# Patient Record
Sex: Female | Born: 1990
Health system: Southern US, Community
[De-identification: ages and names within clinical notes are randomized; demographics above are authoritative.]

## PROBLEM LIST (undated history)

## (undated) ENCOUNTER — Inpatient Hospital Stay: Payer: Self-pay

## (undated) DIAGNOSIS — N200 Calculus of kidney: Secondary | ICD-10-CM

## (undated) DIAGNOSIS — F32A Depression, unspecified: Secondary | ICD-10-CM

## (undated) DIAGNOSIS — N3090 Cystitis, unspecified without hematuria: Secondary | ICD-10-CM

## (undated) DIAGNOSIS — G43009 Migraine without aura, not intractable, without status migrainosus: Secondary | ICD-10-CM

## (undated) DIAGNOSIS — F329 Major depressive disorder, single episode, unspecified: Secondary | ICD-10-CM

## (undated) DIAGNOSIS — F419 Anxiety disorder, unspecified: Secondary | ICD-10-CM

## (undated) DIAGNOSIS — M25569 Pain in unspecified knee: Secondary | ICD-10-CM

## (undated) HISTORY — DX: Migraine without aura, not intractable, without status migrainosus: G43.009

## (undated) HISTORY — DX: Depression, unspecified: F32.A

## (undated) HISTORY — DX: Anxiety disorder, unspecified: F41.9

## (undated) HISTORY — DX: Cystitis, unspecified without hematuria: N30.90

## (undated) HISTORY — DX: Calculus of kidney: N20.0

## (undated) HISTORY — DX: Major depressive disorder, single episode, unspecified: F32.9

## (undated) HISTORY — DX: Pain in unspecified knee: M25.569

---
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
## 2010-02-24 NOTE — Assessment & Plan Note (Signed)
Summary: 2:45  EAR/CLE   Vital Signs:  Patient profile:   20 year old female Weight:      115 pounds Temp:     98.1 degrees F oral Pulse rate:   72 / minute Pulse rhythm:   regular BP sitting:   108 / 68  (left arm) Cuff size:   regular  Vitals Entered By: Mervin Hack CMA Duncan Dull) (February 05, 2009 3:09 PM) CC: left ear pain   History of Present Illness: Having left ear pain started yesterday Especially bad if she touches it No discharge  No swimming  No cold symptoms no fever  tried tylenol and ibuprofen no help  Allergies: No Known Drug Allergies  Past History:  Past medical, surgical, family and social histories (including risk factors) reviewed for relevance to current acute and chronic problems.  Family History: Reviewed history from 09/20/2006 and no changes required. Dad with cardiomyopathy--diagnosed at age 84. Getting pacer in his 74's Carmelina Dane also with cardiomyopathy and died at 64  Social History: Reviewed history from 10/11/2007 and no changes required. 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   Head:  no sinus tenderness Ears:  marked tenderness to speculum but not with tragal movement on left moderate canal erythema with discharge TM normal  right ear normal Mouth:  no erythema and no exudates.     Impression & Recommendations:  Problem # 1:  OTITIS EXTERNA (ICD-380.10) Assessment New  no clear etiology but diagnosis is clear  will treat wtih analgesics and drops  Her updated medication list for this problem includes:    Cortisporin-tc 3.03-27-08-0.5 Mg/ml Susp (Neomycin-colist-hc-thonzonium) .Marland KitchenMarland KitchenMarland KitchenMarland Kitchen 4-5 drops in the left ear three times a day for 5 days  Complete Medication List: 1)  Multivitamins Tabs  (Multiple vitamin) .... Take 1 tablet by mouth once a day 2)  Cortisporin-tc 3.03-27-08-0.5 Mg/ml Susp (Neomycin-colist-hc-thonzonium) .... 4-5 drops in the left ear three times a day for 5 days  Patient Instructions: 1)  Try ibuprofen 400-600mg  three times a day for the pain 2)  Please schedule a follow-up appointment as needed .  Prescriptions: CORTISPORIN-TC 3.03-27-08-0.5 MG/ML SUSP (NEOMYCIN-COLIST-HC-THONZONIUM) 4-5 drops in the left ear three times a day for 5 days  #1 bottle x 1   Entered and Authorized by:   Cindee Salt MD   Signed by:   Cindee Salt MD on 02/05/2009   Method used:   Electronically to        [id number] (retail)       [address] [place], [place]  [address]       Ph: [phone] or [phone]       Fax: [phone]   RxID:   [id number]   Current Allergies (reviewed today): No known allergies

## 2010-07-27 ENCOUNTER — Ambulatory Visit (INDEPENDENT_AMBULATORY_CARE_PROVIDER_SITE_OTHER): Payer: BC Managed Care – PPO | Admitting: Internal Medicine

## 2010-07-27 ENCOUNTER — Encounter: Payer: Self-pay | Admitting: Internal Medicine

## 2010-07-27 VITALS — BP 110/60 | HR 88 | Temp 98.7°F | Ht 64.0 in | Wt 129.0 lb

## 2010-07-27 DIAGNOSIS — J039 Acute tonsillitis, unspecified: Secondary | ICD-10-CM

## 2010-07-27 MED ORDER — PENICILLIN V POTASSIUM 500 MG PO TABS: 1000.0000 mg | ORAL_TABLET | Freq: Two times a day (BID) | ORAL | Status: AC

## 2010-07-27 NOTE — Progress Notes (Signed)
  Subjective:    Patient ID: Stacey Curtis, female    DOB: 1990/01/28, 20 y.o.   MRN: [id number]  HPI Has had recurrent sore throats Tonsillitis and strep several times Treated at [facility] where she goes to school  Some issue with compliance per mom---patient states she has taken all the last time---about 6 weeks ago Pain started again 4-5 days ago Neck glands swelled No fever Pain is constant in throat--worse with drinking or eating Only a little cough in AM  Had consultation with ENT in [place] where dad lives Tonsillectomy recommended but she was too scared to proceed  No current outpatient prescriptions on file prior to visit.    No Known Allergies  No past medical history on file.  No past surgical history on file.  No family history on file.  History   Social History  . Marital Status: Single    Spouse Name: N/A    Number of Children: N/A  . Years of Education: N/A   Occupational History  . Not on file.   Social History Main Topics  . Smoking status: Never Smoker   . Smokeless tobacco: Never Used  . Alcohol Use: No  . Drug Use: No  . Sexually Active:    Other Topics Concern  . Not on file   Social History Narrative   Parents divorced---lives with Mom--currently in 30 th grade--9/09[place]2 sister and brother   Review of Systems Still able to eat and drink No urinary problems now No rash Gets "fever blisters inside mouth" also--not associated directly with the sore throat    Objective:   Physical Exam  Constitutional: She appears well-developed and well-nourished. No distress.  HENT:  Head: Normocephalic and atraumatic.  Right Ear: External ear normal.  Left Ear: External ear normal.       2+ tonsils with moderate inflammation but no sig exudates  Neck: Normal range of motion.       Fairly tender anterior cervical nodes bilaterally  Pulmonary/Chest: Effort normal and breath sounds normal. No respiratory  distress. She has no wheezes. She has no rales.  Lymphadenopathy:    She has cervical adenopathy.  Skin: Skin is warm. No rash noted.          Assessment & Plan:

## 2010-07-27 NOTE — Assessment & Plan Note (Signed)
Has had recurrent strep Will treat with PCN again Discussed the tonsillectomy--she and mom will consider again

## 2010-08-26 ENCOUNTER — Ambulatory Visit: Payer: Self-pay | Admitting: Otolaryngology

## 2012-09-05 ENCOUNTER — Ambulatory Visit (INDEPENDENT_AMBULATORY_CARE_PROVIDER_SITE_OTHER): Payer: BC Managed Care – PPO | Admitting: Internal Medicine

## 2012-09-05 ENCOUNTER — Encounter: Payer: Self-pay | Admitting: Internal Medicine

## 2012-09-05 VITALS — BP 100/60 | HR 77 | Wt 135.0 lb

## 2012-09-05 DIAGNOSIS — R41840 Attention and concentration deficit: Secondary | ICD-10-CM

## 2012-09-05 DIAGNOSIS — R51 Headache: Secondary | ICD-10-CM

## 2012-09-05 DIAGNOSIS — R519 Headache, unspecified: Secondary | ICD-10-CM | POA: Insufficient documentation

## 2012-09-05 LAB — HEPATIC FUNCTION PANEL
AST: 17 U/L (ref 0–37)
Albumin: 4.6 g/dL (ref 3.5–5.2)
Total Protein: 7.6 g/dL (ref 6.0–8.3)

## 2012-09-05 LAB — TSH: TSH: 3 u[IU]/mL (ref 0.35–5.50)

## 2012-09-05 LAB — CBC WITH DIFFERENTIAL/PLATELET
Basophils Absolute: 0 10*3/uL (ref 0.0–0.1)
Eosinophils Relative: 3.1 % (ref 0.0–5.0)
HCT: 40.4 % (ref 36.0–46.0)
Lymphs Abs: 1.9 10*3/uL (ref 0.7–4.0)
MCV: 87.6 fl (ref 78.0–100.0)
Monocytes Absolute: 0.7 10*3/uL (ref 0.1–1.0)
Neutro Abs: 3.8 10*3/uL (ref 1.4–7.7)
Platelets: 245 10*3/uL (ref 150.0–400.0)
RDW: 13.2 % (ref 11.5–14.6)

## 2012-09-05 LAB — BASIC METABOLIC PANEL
BUN: 11 mg/dL (ref 6–23)
Chloride: 105 mEq/L (ref 96–112)
Glucose, Bld: 97 mg/dL (ref 70–99)
Potassium: 4 mEq/L (ref 3.5–5.1)

## 2012-09-05 LAB — T4, FREE: Free T4: 0.84 ng/dL (ref 0.60–1.60)

## 2012-09-05 MED ORDER — CITALOPRAM HYDROBROMIDE 20 MG PO TABS: 10.0000 mg | ORAL_TABLET | Freq: Every day | ORAL | Status: DC

## 2012-09-05 MED ORDER — SUMATRIPTAN SUCCINATE 100 MG PO TABS: 50.0000 mg | ORAL_TABLET | Freq: Every day | ORAL | Status: DC | PRN

## 2012-09-05 NOTE — Assessment & Plan Note (Signed)
Features of migraines which she had years ago Will give imitrex for prn No worrisome neuro findings

## 2012-09-05 NOTE — Assessment & Plan Note (Addendum)
This is her most concerning symptom No obvious physical problems but will check labs No neuro findings of concern Explained that this can't be ADHD without prior problems   Could be from the anxiety---seems to be daily Not depressed May want to consider SSRI--discussed more. She would like to try Rx
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
## 2012-09-06 ENCOUNTER — Encounter: Payer: Self-pay | Admitting: *Deleted

## 2012-09-06 ENCOUNTER — Telehealth: Payer: Self-pay | Admitting: *Deleted

## 2012-09-06 NOTE — Telephone Encounter (Signed)
Pt was in recently for OV and forgot to ask about birth control, per pt she was on the lowest dose of estrogen birth control pill, pt would like that again. Please advise

## 2012-09-07 NOTE — Telephone Encounter (Signed)
Please find out exactly what she was taking (from pharmacist if necessary)  Okay to refill x 1 year

## 2012-09-11 MED ORDER — NORETHIN ACE-ETH ESTRAD-FE 1-20 MG-MCG PO TABS: 1.0000 | ORAL_TABLET | Freq: Every day | ORAL | Status: DC

## 2012-09-11 NOTE — Telephone Encounter (Signed)
Okay to send Rx for the generic of lo-estrin 1/20 1 year Rx Have her call if breakthrough bleeding or other problems

## 2012-09-11 NOTE — Telephone Encounter (Signed)
rx sent to pharmacy by e-script Spoke with patient and advised results   

## 2012-09-11 NOTE — Telephone Encounter (Signed)
Spoke with patient and she doesn't remember what she was taking and she can't remember which pharmacy back home she was using because it's been so long ago, she would like for you just to prescribe a low dose estrogen pill

## 2012-09-18 ENCOUNTER — Ambulatory Visit: Payer: BC Managed Care – PPO | Admitting: Internal Medicine

## 2012-09-18 DIAGNOSIS — Z0289 Encounter for other administrative examinations: Secondary | ICD-10-CM

## 2012-10-18 ENCOUNTER — Telehealth: Payer: Self-pay | Admitting: Internal Medicine

## 2012-10-18 NOTE — Telephone Encounter (Signed)
You can skip the sugar pills that are in the pack for the last week and restart a new pack if you want to avoid getting a period. I am not sure why she hasn't had a withdrawal bleed (period) since she hasn't taken the pill in the last week.  I would recommend she take the rest of the pack normally and take the non medicine pills for the 4th week. If she doesn't have a period then, we may need to increase the estrogen part of her pills. She should be sure to use condoms (always---but especially in the next month since she has had a gap with the meds) She should do a pregnancy test to be sure she isn't pregnant if she has had sex in the past couple of weeks  Let me know if you think I need to talk to her

## 2012-10-18 NOTE — Telephone Encounter (Signed)
Patient Information:  Caller Name: Latesha  Phone: [phone]  Patient: Stacey Curtis  Gender: Female  DOB: 1991/01/25  Age: 22 Years  PCP: Tillman Abide [facility])  Pregnant: No  Office Follow Up:  Does the office need to follow up with this patient?: Yes  Instructions For The Office: Patient has questions regardin birth control.  RN Note:  Used Hormonal Contraceptive guideline in [facility], disposition: see provider within 2 weeks for "Requesting information about birth control." Advised patient to take a pregnancy test at this time to ensure no pregnancy. Please contact patient with further instructions regarding what she needs to do about her oral contraception.  Symptoms  Reason For Call & Symptoms: Has been taking Microgestin for birth control. She skipped the last week of pills in her pack and instead took 2 pills of the newest pack. She has since not taken any pills at all for 7 days. Has not had a period. Reports some mild spotting one day, but nothing else. Has not take a pregnancy test. Reports increased fatigue, but is able to perform daily activities.  Reviewed Health History In EMR: Yes  Reviewed Medications In EMR: Yes  Reviewed Allergies In EMR: Yes  Reviewed Surgeries / Procedures: Yes  Date of Onset of Symptoms: 10/11/2012 OB / GYN:  LMP: 08/31/2012  Guideline(s) Used:  No Protocol Available - Information Only  Disposition Per Guideline:   Discuss with PCP and Callback by Nurse Today  Reason For Disposition Reached:   Nursing judgment  Advice Given:  N/A  Patient Will Follow Care Advice:  YES

## 2012-10-19 NOTE — Telephone Encounter (Signed)
Spoke with patient and advised results, she understood

## 2012-11-01 ENCOUNTER — Encounter: Payer: Self-pay | Admitting: *Deleted

## 2012-11-01 ENCOUNTER — Encounter: Payer: Self-pay | Admitting: Internal Medicine

## 2012-11-01 ENCOUNTER — Ambulatory Visit (INDEPENDENT_AMBULATORY_CARE_PROVIDER_SITE_OTHER): Payer: BC Managed Care – PPO | Admitting: Internal Medicine

## 2012-11-01 VITALS — BP 110/70 | HR 87 | Temp 98.0°F | Wt 136.0 lb

## 2012-11-01 DIAGNOSIS — R41840 Attention and concentration deficit: Secondary | ICD-10-CM

## 2012-11-01 DIAGNOSIS — J029 Acute pharyngitis, unspecified: Secondary | ICD-10-CM

## 2012-11-01 NOTE — Assessment & Plan Note (Signed)
Only took the SSRI for 2 weeks and stopped it Convinced she doesn't have anxiety (and this may be correct) Discussed that she doesn't fit criteria for ADHD and I am not comfortable with adderall Rx Offered psychologist to do testing -she didn't want this

## 2012-11-01 NOTE — Assessment & Plan Note (Signed)
Seems like classic viral infection Discussed supportive care

## 2012-11-01 NOTE — Progress Notes (Signed)
  Subjective:    Patient ID: Stacey Curtis, female    DOB: Sep 15, 1990, 22 y.o.   MRN: [id number]  HPI Having bad sore throat for 3 days Coughing fits-- got some blood (no mucus--seems to be from back of her throat) Not really congested Fever for the first day No SOB No ear pain  Only tried robitussin for cough Allergy med no help  Stopped the nerve med--it didn't work Wound up dropping out of [employer] Now working full time for [employer]  Current Outpatient Prescriptions on File Prior to Visit  Medication Sig Dispense Refill  . norethindrone-ethinyl estradiol (JUNEL FE,GILDESS FE,LOESTRIN FE) 1-20 MG-MCG tablet Take 1 tablet by mouth daily.  1 Package  11  . SUMAtriptan (IMITREX) 100 MG tablet Take 0.5-1 tablets (50-100 mg total) by mouth daily as needed for migraine.  10 tablet  1   No current facility-administered medications on file prior to visit.    No Known Allergies  No past medical history on file.  No past surgical history on file.  No family history on file.  History   Social History  . Marital Status: Single    Spouse Name: N/A    Number of Children: N/A  . Years of Education: N/A   Occupational History  . 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 Smoking status: Never Smoker   . Smokeless tobacco: Never Used  . Alcohol Use: No  . Drug Use: No  . Sexual Activity:    Other Topics Concern  . Not on file   Social History Narrative                      Review of Systems Period did stop---had spotting for 2 days Okay with this now     Objective:   Physical Exam  Constitutional: She appears well-developed and well-nourished. No distress.  HENT:  Head: Normocephalic and atraumatic.  Mild pharyngeal injection without exudates Mild nasal congestion TMs normal  Neck: Normal range of motion. Neck supple. No thyromegaly present.  Pulmonary/Chest: Effort normal and breath sounds normal. No respiratory distress. She  has no wheezes. She has no rales.  Lymphadenopathy:    She has no cervical adenopathy.  Skin: No rash noted.  Psychiatric:  Tearful when I was not excited about prescribing adderall for her          Assessment & Plan:

## 2013-01-10 ENCOUNTER — Ambulatory Visit (INDEPENDENT_AMBULATORY_CARE_PROVIDER_SITE_OTHER): Payer: BC Managed Care – PPO | Admitting: Family Medicine

## 2013-01-10 VITALS — BP 98/68 | HR 77 | Temp 98.3°F | Resp 18 | Ht 65.0 in | Wt 134.0 lb

## 2013-01-10 DIAGNOSIS — R319 Hematuria, unspecified: Secondary | ICD-10-CM

## 2013-01-10 DIAGNOSIS — N390 Urinary tract infection, site not specified: Secondary | ICD-10-CM

## 2013-01-10 DIAGNOSIS — R309 Painful micturition, unspecified: Secondary | ICD-10-CM

## 2013-01-10 LAB — POCT UA - MICROSCOPIC ONLY
Bacteria, U Microscopic: NEGATIVE
Casts, Ur, LPF, POC: NEGATIVE
Crystals, Ur, HPF, POC: NEGATIVE
Mucus, UA: NEGATIVE
RBC, urine, microscopic: NEGATIVE
Yeast, UA: NEGATIVE

## 2013-01-10 LAB — POCT URINALYSIS DIPSTICK
Blood, UA: NEGATIVE
Glucose, UA: 250
Ketones, UA: 15
Nitrite, UA: POSITIVE
Protein, UA: 300
Spec Grav, UA: 1.015
Urobilinogen, UA: >=8
pH, UA: 5

## 2013-01-10 MED ORDER — CEPHALEXIN 500 MG PO CAPS: 500.0000 mg | ORAL_CAPSULE | Freq: Four times a day (QID) | ORAL | Status: DC

## 2013-01-10 MED ORDER — TRAMADOL HCL 50 MG PO TABS: 50.0000 mg | ORAL_TABLET | Freq: Three times a day (TID) | ORAL | Status: DC | PRN

## 2013-01-10 NOTE — Progress Notes (Signed)
Subjective:    Patient ID: Stacey Curtis, female    DOB: 06-24-1990, 22 y.o.   MRN: [id number] Chief Complaint  Patient presents with  . Hematuria    x2 days   . Urinary Retention    HPI  Sees urology Dr. Achilles Dunk in [place] for the past several yrs due to h/o recurrent cystitis and pyelonephritis of unknown etiology.  Was started on prophylactic macrobid qd but was able to wean off and did not have any sxs in over a yr.  Last night sxs returned and she saw drops of blood in the toilet which she thinks it was from her bladder. She has a h/o resistance to Bactrim but had a rx for it and took a dose last night and again this morning but doesn't seem to be helping.  Also started on Azo last night. No f/c, no vag d/c, was nauseated yesterday but resolved now, no vomiting. No diarrhea or constipation, no back/flank/abd/pelvic pain.  History reviewed. No pertinent past medical history. Current Outpatient Prescriptions on File Prior to Visit  Medication Sig Dispense Refill  . norethindrone-ethinyl estradiol (JUNEL FE,GILDESS FE,LOESTRIN FE) 1-20 MG-MCG tablet Take 1 tablet by mouth daily.  1 Package  11  . SUMAtriptan (IMITREX) 100 MG tablet Take 0.5-1 tablets (50-100 mg total) by mouth daily as needed for migraine.  10 tablet  1   No current facility-administered medications on file prior to visit.   No Known Allergies   Review of Systems  Constitutional: Negative for fever, chills, diaphoresis, activity change, appetite change, fatigue and unexpected weight change.  Gastrointestinal: Positive for nausea. Negative for vomiting, abdominal pain, diarrhea, constipation, blood in stool, anal bleeding and rectal pain.  Genitourinary: Positive for dysuria, frequency, hematuria and difficulty urinating. Negative for urgency, decreased urine volume, vaginal bleeding, vaginal discharge, genital sores, vaginal pain, menstrual problem, pelvic pain and dyspareunia.  Musculoskeletal: Negative for gait  problem.  Skin: Negative for rash.  Hematological: Negative for adenopathy.  Psychiatric/Behavioral: The patient is not nervous/anxious.       BP 98/68  Pulse 77  Temp(Src) 98.3 F (36.8 C) (Oral)  Resp 18  Ht 5\' 5"  (1.651 m)  Wt 134 lb (60.782 kg)  BMI 22.30 kg/m2  SpO2 98%  LMP 12/12/2012 Objective:   Physical Exam  Constitutional: She is oriented to person, place, and time. She appears well-developed and well-nourished. No distress.  HENT:  Head: Normocephalic and atraumatic.  Cardiovascular: Normal rate, regular rhythm, normal heart sounds and intact distal pulses.   Pulmonary/Chest: Effort normal and breath sounds normal.  Abdominal: Soft. Bowel sounds are normal. She exhibits no distension. There is no hepatosplenomegaly. There is no tenderness. There is no rebound, no guarding and no CVA tenderness.  Neurological: She is alert and oriented to person, place, and time.  Skin: Skin is warm and dry. She is not diaphoretic.  Psychiatric: She has a normal mood and affect. Her behavior is normal.          Results for orders placed in visit on 01/10/13  POCT UA - MICROSCOPIC ONLY      Result Value Range   WBC, Ur, HPF, POC TNTC     RBC, urine, microscopic neg     Bacteria, U Microscopic neg     Mucus, UA neg     Epithelial cells, urine per micros TNTC     Crystals, Ur, HPF, POC neg     Casts, Ur, LPF, POC neg     Yeast, UA  neg    POCT URINALYSIS DIPSTICK      Result Value Range   Color, UA red     Clarity, UA clear     Glucose, UA 250     Bilirubin, UA lg     Ketones, UA 15     Spec Grav, UA 1.015     Blood, UA neg     pH, UA 5.0     Protein, UA 300     Urobilinogen, UA >=8.0     Nitrite, UA positive     Leukocytes, UA large (3+)      Assessment & Plan:   Blood in urine - Plan: POCT UA - Microscopic Only, POCT urinalysis dipstick  Pain with urination - Plan: POCT UA - Microscopic Only, POCT urinalysis dipstick  UTI - Plan: Urine culture - prev on  prophylactic macrobid and h/o resistance to bactrim - UA looks very much like UTI and pt has already started partial trx w/ bactrim so will cover w/ full dose of kelfex while UClx is P - f/u w/ urologist for further managmement due to extensive hx.  Meds ordered this encounter  Medications  . Phenazopyridine HCl (AZO TABS PO)    Sig: Take by mouth.  . DISCONTD: nitrofurantoin, macrocrystal-monohydrate, (MACROBID) 100 MG capsule    Sig: Take 100 mg by mouth 2 (two) times daily.  Marland Kitchen DISCONTD: sulfamethoxazole-trimethoprim (BACTRIM DS) 800-160 MG per tablet    Sig: Take 1 tablet by mouth 2 (two) times daily.  . cephALEXin (KEFLEX) 500 MG capsule    Sig: Take 1 capsule (500 mg total) by mouth 4 (four) times daily.    Dispense:  28 capsule    Refill:  0  . traMADol (ULTRAM) 50 MG tablet    Sig: Take 1 tablet (50 mg total) by mouth every 8 (eight) hours as needed for moderate pain.    Dispense:  16 tablet    Refill:  0   Norberto Sorenson, MD MPH

## 2013-01-10 NOTE — Patient Instructions (Signed)
Urinary Tract Infection  Urinary tract infections (UTIs) can develop anywhere along your urinary tract. Your urinary tract is your body's drainage system for removing wastes and extra water. Your urinary tract includes two kidneys, two ureters, a bladder, and a urethra. Your kidneys are a pair of bean-shaped organs. Each kidney is about the size of your fist. They are located below your ribs, one on each side of your spine.  CAUSES  Infections are caused by microbes, which are microscopic organisms, including fungi, viruses, and bacteria. These organisms are so small that they can only be seen through a microscope. Bacteria are the microbes that most commonly cause UTIs.  SYMPTOMS   Symptoms of UTIs may vary by age and gender of the patient and by the location of the infection. Symptoms in young women typically include a frequent and intense urge to urinate and a painful, burning feeling in the bladder or urethra during urination. Older women and men are more likely to be tired, shaky, and weak and have muscle aches and abdominal pain. A fever may mean the infection is in your kidneys. Other symptoms of a kidney infection include pain in your back or sides below the ribs, nausea, and vomiting.  DIAGNOSIS  To diagnose a UTI, your caregiver will ask you about your symptoms. Your caregiver also will ask to provide a urine sample. The urine sample will be tested for bacteria and white blood cells. White blood cells are made by your body to help fight infection.  TREATMENT   Typically, UTIs can be treated with medication. Because most UTIs are caused by a bacterial infection, they usually can be treated with the use of antibiotics. The choice of antibiotic and length of treatment depend on your symptoms and the type of bacteria causing your infection.  HOME CARE INSTRUCTIONS   If you were prescribed antibiotics, take them exactly as your caregiver instructs you. Finish the medication even if you feel better after you  have only taken some of the medication.   Drink enough water and fluids to keep your urine clear or pale yellow.   Avoid caffeine, tea, and carbonated beverages. They tend to irritate your bladder.   Empty your bladder often. Avoid holding urine for long periods of time.   Empty your bladder before and after sexual intercourse.   After a bowel movement, women should cleanse from front to back. Use each tissue only once.  SEEK MEDICAL CARE IF:    You have back pain.   You develop a fever.   Your symptoms do not begin to resolve within 3 days.  SEEK IMMEDIATE MEDICAL CARE IF:    You have severe back pain or lower abdominal pain.   You develop chills.   You have nausea or vomiting.   You have continued burning or discomfort with urination.  MAKE SURE YOU:    Understand these instructions.   Will watch your condition.   Will get help right away if you are not doing well or get worse.  Document Released: 10/21/2004 Document Revised: 07/13/2011 Document Reviewed: 02/19/2011  Exit[employer] Patient Information 2014 [employer].

## 2013-01-11 ENCOUNTER — Telehealth: Payer: Self-pay

## 2013-01-11 LAB — URINE CULTURE

## 2013-01-11 MED ORDER — ONDANSETRON 8 MG PO TBDP: 8.0000 mg | ORAL_TABLET | Freq: Three times a day (TID) | ORAL | Status: DC | PRN

## 2013-01-11 NOTE — Telephone Encounter (Signed)
Patient is calling to ask a nurse or doctor about some of the symptoms she is having after her visit yesterday  [phone]

## 2013-01-11 NOTE — Telephone Encounter (Signed)
She has vomited after taking the Tramadol. Advised her to discontinue this, get lots of fluids, and take the antibiotics. She should really push fluids and come back in if she is worsening. She states she will increase her fluid intake and come in if not improving tomorrow advised her to come in sooner if worse, or to ER.

## 2013-01-11 NOTE — Telephone Encounter (Signed)
Spoke with  Pt, she is very nauseous and would like a nausea med sent in. Please advise

## 2013-01-11 NOTE — Telephone Encounter (Signed)
Zofran sent. 

## 2013-01-19 ENCOUNTER — Encounter: Payer: Self-pay | Admitting: Family Medicine

## 2013-01-19 ENCOUNTER — Ambulatory Visit (INDEPENDENT_AMBULATORY_CARE_PROVIDER_SITE_OTHER): Payer: BC Managed Care – PPO | Admitting: Family Medicine

## 2013-01-19 ENCOUNTER — Telehealth: Payer: Self-pay | Admitting: Family Medicine

## 2013-01-19 VITALS — BP 116/62 | HR 79 | Temp 97.9°F | Ht 64.0 in | Wt 135.8 lb

## 2013-01-19 DIAGNOSIS — T753XXA Motion sickness, initial encounter: Secondary | ICD-10-CM

## 2013-01-19 DIAGNOSIS — N390 Urinary tract infection, site not specified: Secondary | ICD-10-CM

## 2013-01-19 MED ORDER — NITROFURANTOIN MONOHYD MACRO 100 MG PO CAPS: 100.0000 mg | ORAL_CAPSULE | Freq: Two times a day (BID) | ORAL | Status: DC

## 2013-01-19 MED ORDER — SCOPOLAMINE 1 MG/3DAYS TD PT72: 1.0000 | MEDICATED_PATCH | TRANSDERMAL | Status: DC

## 2013-01-19 MED ORDER — SULFAMETHOXAZOLE-TMP DS 800-160 MG PO TABS: 1.0000 | ORAL_TABLET | Freq: Two times a day (BID) | ORAL | Status: DC

## 2013-01-19 NOTE — Progress Notes (Signed)
Date:  01/19/2013   Name:  Stacey Curtis   DOB:  [birth year]   MRN:  [id number] Gender: female Age: 22 y.o.  PCP:  Tillman Abide, MD   History of Present Illness:  Patient presents with burning, urgency. No vaginal discharge or external irritation.  No STD exposure. No abd pain, no flank pain.  History is reviewed in prior office visit where she went [facility], and Dr. Clelia Croft gave her a course of Keflex. The culture ended up having multiple colonies, so the patient is still quite symptomatic. She did have some Septra at home, which she took and felt better while she was taking it, but now she is still has had a return of symptoms.  She also needs to have some scopolamine patches for a upcoming trip.  Patient Active Problem List   Diagnosis Date Noted  . Worsening headaches 09/05/2012  . Disturbed concentration 09/05/2012  . PATELLO-FEMORAL SYNDROME 08/16/2008  . COMMON MIGRAINE 05/24/2008  . FIBROCYSTIC BREAST DISEASE, MINIMAL 05/01/2008    No past medical history on file.  Past Surgical History  Procedure Laterality Date  . Tonsillectomy      History   Social History  . Marital Status: Single    Spouse Name: N/A    Number of Children: N/A  . Years of Education: N/A   Occupational History  . 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 Smoking status: Never Smoker   . Smokeless tobacco: Never Used  . Alcohol Use: No  . Drug Use: No  . Sexual Activity: Not on file   Other Topics Concern  . Not on file   Social History Narrative                       No family history on file.  No Known Allergies  Medication list reviewed and updated in full in [facility].  ROS: GEN:  no fevers, chills. GI: No n/v/d, eating normally Otherwise, ROS is as per the HPI.  PHYSICAL EXAM  Filed Vitals:   01/19/13 1023  BP: 116/62  Pulse: 79  Temp: 97.9 F (36.6 C)  TempSrc: Oral  Height: 5\' 4"  (1.626 m)    Weight: 135 lb 12 oz (61.576 kg)  SpO2: 97%    GEN: WDWN, A&Ox4,NAD. Non-toxic HEENT: Atraumatc, normocephalic. CV: RRR, No M/G/R PULM: CTA B, No wheezes, crackles, or rhonchi ABD: S, NT, ND, +BS, no rebound. No CVAT. + suprapubic tenderness. EXT: No c/c/e  Objective Data: Results for orders placed in visit on 01/10/13  URINE CULTURE      Result Value Range   Colony Count 40,000 COLONIES/ML     Organism ID, Bacteria Multiple bacterial morphotypes present, none     Organism ID, Bacteria predominant. Suggest appropriate recollection if      Organism ID, Bacteria clinically indicated.    POCT UA - MICROSCOPIC ONLY      Result Value Range   WBC, Ur, HPF, POC TNTC     RBC, urine, microscopic neg     Bacteria, U Microscopic neg     Mucus, UA neg     Epithelial cells, urine per micros TNTC     Crystals, Ur, HPF, POC neg     Casts, Ur, LPF, POC neg     Yeast, UA neg    POCT URINALYSIS DIPSTICK      Result Value Range   Color,  UA red     Clarity, UA clear     Glucose, UA 250     Bilirubin, UA lg     Ketones, UA 15     Spec Grav, UA 1.015     Blood, UA neg     pH, UA 5.0     Protein, UA 300     Urobilinogen, UA >=8.0     Nitrite, UA positive     Leukocytes, UA large (3+)      UTI (urinary tract infection)  Sea sickness, initial encounter   UTI, unclear organism causing this. U. Cx. meds for all  Refill her macrobid until sees Dr. Achilles Dunk   Meds ordered this encounter  Medications  . sulfamethoxazole-trimethoprim (BACTRIM DS) 800-160 MG per tablet    Sig: Take 1 tablet by mouth 2 (two) times daily.    Dispense:  28 tablet    Refill:  0  . nitrofurantoin, macrocrystal-monohydrate, (MACROBID) 100 MG capsule    Sig: Take 1 capsule (100 mg total) by mouth 2 (two) times daily.    Dispense:  60 capsule    Refill:  2  . scopolamine (TRANSDERM-SCOP) 1.5 MG    Sig: Place 1 patch (1.5 mg total) onto the skin every 3 (three) days.    Dispense:  10 patch    Refill:  0      Signed,  Lula Kolton T. Hershel Corkery, MD, CAQ Sports Medicine  [facility] [address] [place] [address] Phone: [phone] Fax: [phone]

## 2013-01-19 NOTE — Telephone Encounter (Signed)
Called pt and notified her that her rx has been sent to the pharmacy for the motion sickness patches

## 2013-01-19 NOTE — Telephone Encounter (Signed)
Pt needs a motion sickness patch for behind her ear for her cruise. She will be gone for 10 days so she is not sure how many patches that would be but she needs enough for 10 days.  She is leaving at 12pm.  She said she just forgot to ask for this.  [facility] [address]

## 2013-01-19 NOTE — Progress Notes (Signed)
Pre-visit discussion using our clinic review tool. No additional management support is needed unless otherwise documented below in the visit note.  

## 2013-01-19 NOTE — Telephone Encounter (Signed)
done

## 2013-01-21 LAB — URINE CULTURE: Colony Count: 40000

## 2013-01-22 ENCOUNTER — Encounter: Payer: Self-pay | Admitting: *Deleted

## 2013-01-31 ENCOUNTER — Telehealth: Payer: Self-pay

## 2013-01-31 MED ORDER — LEVOFLOXACIN 500 MG PO TABS: 500.0000 mg | ORAL_TABLET | Freq: Every day | ORAL | Status: DC

## 2013-01-31 NOTE — Telephone Encounter (Signed)
Patient notified as instructed by telephone.  Levaquin prescription sent to [facility] [place].

## 2013-01-31 NOTE — Telephone Encounter (Signed)
Pt called Dr Cope's office and computers are down this morning and advised pt to cb to Dr KB Home	[place] 02/01[place]13; pt said will take several days to get appt with Dr Achilles Dunkope and pt is on day 13 of antibiotic and symptoms have not really improved. Pt continues with urgency and voiding small amounts frequently and pain when urinates. No fever. Pt wants to know if Dr Patsy Lageropland can suggest what to do until can see Dr Achilles Dunkope? [facility] [place]. Pt request cb.

## 2013-01-31 NOTE — Telephone Encounter (Signed)
Call  This is very challenging since 2 urine cultures did not grow out a predominant bacteria, so we are going to have to change to a different class of abx and hope for the best.  Levaquin 500 mg, 1 po daily, #10, 0 ref

## 2013-04-12 ENCOUNTER — Telehealth: Payer: Self-pay

## 2013-04-12 MED ORDER — SULFAMETHOXAZOLE-TMP DS 800-160 MG PO TABS: 1.0000 | ORAL_TABLET | Freq: Two times a day (BID) | ORAL | Status: DC

## 2013-04-12 NOTE — Telephone Encounter (Signed)
rx sent to pharmacy by e-script Spoke with patient and advised results   

## 2013-04-12 NOTE — Telephone Encounter (Signed)
I would like to defer to Dr. Alphonsus SiasLetvak who is her regular MD. We all have different practice patterns with how we handle this.   I would probably be ok with calling abx in this case, but he may not be.

## 2013-04-12 NOTE — Telephone Encounter (Signed)
Okay to send Rx for generic bactrim DS 1 tab bid #6 x 0 If symptoms persist, will need appt

## 2013-04-12 NOTE — Telephone Encounter (Signed)
Pt cannot come in for appt until next week with urologist and pt request [facility];  04/11/13 started burning and pain upon urination, frequency but voiding very small amounts. No fever. Pt taking AZO but pt said last time she had UTI pt was told by doctor if cannot come in and has symptoms to call and antibiotics will be called in. Pt request note to go to Dr Patsy Lageropland. Pt request cb.

## 2013-07-03 ENCOUNTER — Telehealth: Payer: Self-pay | Admitting: Internal Medicine

## 2013-07-03 NOTE — Telephone Encounter (Signed)
VM not set up yet, will try again later

## 2013-07-03 NOTE — Telephone Encounter (Signed)
Pt returned your call and wants you to call her back at your earliest convenience. [phone] Thank you.

## 2013-07-03 NOTE — Telephone Encounter (Signed)
Spoke with patient and advised her that she contact her high school for her vaccine record, per pt she will

## 2013-07-03 NOTE — Telephone Encounter (Signed)
Pt faxed over forms from [employer] Put on dee's desk

## 2013-07-25 ENCOUNTER — Ambulatory Visit: Payer: BC Managed Care – PPO | Admitting: Internal Medicine

## 2013-08-03 ENCOUNTER — Telehealth: Payer: Self-pay | Admitting: Internal Medicine

## 2013-08-03 ENCOUNTER — Ambulatory Visit (INDEPENDENT_AMBULATORY_CARE_PROVIDER_SITE_OTHER): Payer: BC Managed Care – PPO | Admitting: Internal Medicine

## 2013-08-03 ENCOUNTER — Ambulatory Visit: Payer: BC Managed Care – PPO | Admitting: Internal Medicine

## 2013-08-03 ENCOUNTER — Encounter: Payer: Self-pay | Admitting: Internal Medicine

## 2013-08-03 VITALS — BP 100/60 | HR 76 | Temp 98.3°F | Ht 65.0 in | Wt 130.0 lb

## 2013-08-03 DIAGNOSIS — H01003 Unspecified blepharitis right eye, unspecified eyelid: Secondary | ICD-10-CM

## 2013-08-03 DIAGNOSIS — Z23 Encounter for immunization: Secondary | ICD-10-CM

## 2013-08-03 DIAGNOSIS — H01009 Unspecified blepharitis unspecified eye, unspecified eyelid: Secondary | ICD-10-CM

## 2013-08-03 MED ORDER — SULFACETAMIDE SODIUM 10 % OP SOLN: 2.0000 [drp] | OPHTHALMIC | Status: DC

## 2013-08-03 MED ORDER — SULFACETAMIDE SODIUM 10 % OP OINT: TOPICAL_OINTMENT | Freq: Four times a day (QID) | OPHTHALMIC | Status: DC

## 2013-08-03 NOTE — Progress Notes (Signed)
Pre visit review using our clinic review tool, if applicable. No additional management support is needed unless otherwise documented below in the visit note. 

## 2013-08-03 NOTE — Progress Notes (Signed)
   Subjective:    Patient ID: Stacey Curtis, female    DOB: 1990/05/05, 23 y.o.   MRN: [id number]  HPI Here for immunization review and certification Needs menactra Doesn't want 3rd guardasil  Noticed stye 8 days ago Soreness noted Tried compresses and it drained  Seemed better Noticed swelling in upper lid now (the stye was in lower lid)  No current outpatient prescriptions on file prior to visit.   No current facility-administered medications on file prior to visit.    No Known Allergies  No past medical history on file.  Past Surgical History  Procedure Laterality Date  . Tonsillectomy      No family history on file.  History   Social History  . Marital Status: Single    Spouse Name: N/A    Number of Children: N/A  . Years of Education: N/A   Occupational History  . 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 Smoking status: Never Smoker   . Smokeless tobacco: Never Used  . Alcohol Use: No  . Drug Use: No  . Sexual Activity: Not on file   Other Topics Concern  . Not on file   Social History Narrative                      Review of Systems No contacts No fever Mild URI symptoms in past few days ?vision slightly off in that eye    Objective:   Physical Exam  Eyes:  Mild inflammation and puffiness around puncta of right upper lid No stye now Conjunctiva quiet          Assessment & Plan:

## 2013-08-03 NOTE — Addendum Note (Signed)
Addended by: Tillman AbideLETVAK, RICHARD I on: 08/03/2013 01:40 PM   Modules accepted: Orders

## 2013-08-03 NOTE — Telephone Encounter (Signed)
Patient was seen earlier today for her eye.  Patient went to pick up her prescription for her eye at [facility] and they told her it will take 3 days for them to get the prescription in.  She called other pharmacies and they didn't have the prescription either.  Patient said her eye is killing her and she wants to know if anything else can be called in to the pharmacy.  Please call patient when prescription is called in to the pharmacy.

## 2013-08-03 NOTE — Telephone Encounter (Signed)
Spoke with patient and advised results Spoke with the pharmacy and they do have in stock

## 2013-08-03 NOTE — Telephone Encounter (Signed)
Please call the pharmacy and confirm they have the solution---then let her know the prescription was changed to drops instead of ointment

## 2013-08-03 NOTE — Assessment & Plan Note (Signed)
Discussed compresses still Add sulfacetamide ointment

## 2013-08-03 NOTE — Addendum Note (Signed)
Addended by: Sueanne MargaritaSMITH, DESHANNON L on: 08/03/2013 08:05 AM   Modules accepted: Orders

## 2013-10-15 ENCOUNTER — Telehealth: Payer: Self-pay | Admitting: *Deleted

## 2013-10-15 NOTE — Telephone Encounter (Signed)
Pt called and left message stating that she has a uti, and requested an abx be called in. I returned call to pt, and told her she would need to be seen. I offered her an appointment, but she refused and said she would call her urologist.

## 2013-10-15 NOTE — Telephone Encounter (Signed)
Will do!

## 2013-10-15 NOTE — Telephone Encounter (Signed)
Agreed We can't just treat over the phone  Check on her tomorrow please

## 2013-11-08 ENCOUNTER — Ambulatory Visit (INDEPENDENT_AMBULATORY_CARE_PROVIDER_SITE_OTHER): Payer: 59 | Admitting: Family Medicine

## 2013-11-08 ENCOUNTER — Encounter: Payer: Self-pay | Admitting: Family Medicine

## 2013-11-08 VITALS — BP 90/60 | HR 85 | Temp 98.7°F | Ht 65.0 in | Wt 136.8 lb

## 2013-11-08 DIAGNOSIS — J029 Acute pharyngitis, unspecified: Secondary | ICD-10-CM

## 2013-11-08 LAB — POCT RAPID STREP A (OFFICE): RAPID STREP A SCREEN: NEGATIVE

## 2013-11-08 MED ORDER — AMOXICILLIN 500 MG PO CAPS: 1000.0000 mg | ORAL_CAPSULE | Freq: Two times a day (BID) | ORAL | Status: DC

## 2013-11-08 NOTE — Progress Notes (Signed)
   Dr. Karleen HampshireSpencer T. Ceniyah Thorp, MD, CAQ Sports Medicine Primary [facility] and Sports [facility] [address] [place], [id number] Phone: [phone] Fax: [phone]  11/08/2013  Patient: Stacey Curtis, MRN: [id number], DOB: March 07, 1990, 23 y.o.  Primary Physician:  Tillman Abideichard Letvak, MD  Chief Complaint: Sore Throat  Subjective:   This 23 y.o. female patient presents with sore throat for 2 days. Subjective fevers, achiness, headache. Tmax: 100.3 Some nausea. No significant URI sx. No significant cough.  The PMH, PSH, Social History, Family History, Medications, and allergies have been reviewed in [facility], and have been updated if relevant.   GEN: Acute illness details above GI: Tolerating PO intake GU: maintaining adequate hydration and urination Pulm: No SOB Interactive and getting along well at home. Otherwise, ROS is as per the HPI.  Objective:   Blood pressure 90/60, pulse 85, temperature 98.7 F (37.1 C), temperature source Oral, height 5\' 5"  (1.651 m), weight 136 lb 12 oz (62.029 kg), last menstrual period 10/20/2013.  Gen: WDWN, NAD; A & O x3, cooperative. Pleasant.Globally Non-toxic HEENT: Normocephalic and atraumatic. Throat: s/p tonsillectomy, no exudate R TM clear, L TM - good landmarks, No fluid present. rhinnorhea. No frontal or maxillary sinus T. MMM NECK: Anterior cervical  LAD is present - TTP on the r CV: RRR, No M/G/R, cap refill <2 sec PULM: Breathing comfortably in no respiratory distress. no wheezing, crackles, rhonchi EXT: No c/c/e PSYCH: Friendly, good eye contact MSK: Nml gait   Results for orders placed in visit on 11/08/13  POCT RAPID STREP A (OFFICE)      Result Value Ref Range   Rapid Strep A Screen Negative  Negative    Assessment & Plan:   Sore throat - Plan: POCT rapid strep A  50% of co-workers with strep, classic symptoms. Extremely high pretest probability. Obligated to treat.   Follow-up: No Follow-up on file.  New Prescriptions   AMOXICILLIN (AMOXIL) 500 MG CAPSULE    Take 2 capsules (1,000 mg total) by mouth 2 (two) times daily.   Orders Placed This Encounter  Procedures  . POCT rapid strep A    Signed,  Jamekia Gannett T. Taira Knabe, MD   Patient's Medications  New Prescriptions   AMOXICILLIN (AMOXIL) 500 MG CAPSULE    Take 2 capsules (1,000 mg total) by mouth 2 (two) times daily.  Previous Medications   NITROFURANTOIN, MACROCRYSTAL-MONOHYDRATE, (MACROBID) 100 MG CAPSULE    Take 100 mg by mouth daily.  Modified Medications   No medications on file  Discontinued Medications   SULFACETAMIDE (BLEPH-10) 10 % OPHTHALMIC SOLUTION    Place 2 drops into the right eye every 4 (four) hours.

## 2013-11-08 NOTE — Progress Notes (Signed)
Pre visit review using our clinic review tool, if applicable. No additional management support is needed unless otherwise documented below in the visit note. 

## 2013-12-31 ENCOUNTER — Ambulatory Visit (INDEPENDENT_AMBULATORY_CARE_PROVIDER_SITE_OTHER): Payer: 59 | Admitting: Physician Assistant

## 2013-12-31 VITALS — BP 122/78 | HR 70 | Temp 98.2°F | Resp 18 | Ht 64.0 in | Wt 135.6 lb

## 2013-12-31 DIAGNOSIS — M7712 Lateral epicondylitis, left elbow: Secondary | ICD-10-CM

## 2013-12-31 DIAGNOSIS — M79602 Pain in left arm: Secondary | ICD-10-CM

## 2013-12-31 MED ORDER — HYDROCODONE-ACETAMINOPHEN 5-325 MG PO TABS: 1.0000 | ORAL_TABLET | Freq: Four times a day (QID) | ORAL | Status: DC | PRN

## 2013-12-31 MED ORDER — PREDNISONE 20 MG PO TABS: ORAL_TABLET | ORAL | Status: DC

## 2013-12-31 NOTE — Progress Notes (Signed)
IDENTIFYING INFORMATION  Arcelia Jewatherine A Lee / DOB: 1990-03-08 / MRN: [id number]  The patient has COMMON MIGRAINE; FIBROCYSTIC BREAST DISEASE, MINIMAL; and Disturbed concentration on her problem list.  SUBJECTIVE  CC: Arm Pain   HPI: [name] is a 23 y.o. y.o. female presenting for left sided wrist extensor pain.  She reports that she was refinishing furniture yesterday all day and did this without difficulty.  She woke up last night with 9/10 pain and difficulty with moving her elbow and gripping 2/2 to pain.  She reports tenderness over the wrist extensor complex and has difficulty with moving her elbow and wrist due to pain.  She has tried ibuprofen and acetaminophen without relief.  She has also tried a forearm compression device without relief.   She  has a past medical history of PATELLO-FEMORAL SYNDROME (08/16/2008).    She has a current medication list which includes the following prescription(s): None  [name] has No Known Allergies. She  reports that she has never smoked. She has never used smokeless tobacco. She reports that she does not drink alcohol or use illicit drugs. She  has no sexual activity history on file.  The patient  has past surgical history that includes Tonsillectomy.   Review of Systems  Constitutional: Negative for fever, chills and diaphoresis.  Cardiovascular: Negative.   Musculoskeletal: Positive for myalgias and joint pain.  Skin: Negative for rash.  Neurological: Negative for dizziness and headaches.    OBJECTIVE  Blood pressure 122/78, pulse 70, temperature 98.2 F (36.8 C), temperature source Oral, resp. rate 18, height 5\' 4"  (1.626 m), weight 135 lb 9.6 oz (61.508 kg), last menstrual period 12/25/2013, SpO2 100 %. The patient's body mass index is 23.26 kg/(m^2).  Physical Exam  Constitutional: She is oriented to person, place, and time. She appears well-developed and well-nourished.  Neck: Normal range of motion and full passive range of motion  without pain.  Cardiovascular: Regular rhythm.   Respiratory: Effort normal.  Musculoskeletal:       Arms: Neurological: She is alert and oriented to person, place, and time. She has normal reflexes. No sensory deficit.  Skin: Skin is warm and dry. No rash noted. No erythema. No pallor.  Psychiatric: She has a normal mood and affect. Her behavior is normal. Judgment and thought content normal.    No results found for this or any previous visit (from the past 24 hour(s)).  ASSESSMENT & PLAN  [name] was seen today for arm pain.  Diagnoses and associated orders for this visit:  Lateral epicondylitis (tennis elbow), left: Low suspicion for compartment syndrome given the lack of trauma to the arm, along with reassuring exam.  - HYDROcodone-acetaminophen (NORCO) 5-325 MG per tablet; Take 1 tablet by mouth every 6 (six) hours as needed. - predniSONE (DELTASONE) 20 MG tablet; Take 3 PO QAM x3days, 2 PO QAM x3days, 1 PO QAM x3days -     Patient's arm wrapped with an ace wrap from the elbow to the wrist.  She was also placed in an arm sling and asked to keep the arm immobilized for two weeks.   -     She has agreed to go to ortho for eval and treatment by Thursday if she has seen no improvement in the arm.    The patient was instructed to to call or comeback to clinic as needed, or should symptoms warrant.  Deliah BostonMichael Clark, MHS, PA-C [facility] 12/31/2013 8:18 PM

## 2013-12-31 NOTE — Progress Notes (Signed)
I have discussed this case with Mr. Clark, PA-C and agree.  

## 2014-01-02 ENCOUNTER — Telehealth: Payer: Self-pay

## 2014-01-02 NOTE — Telephone Encounter (Signed)
Patient was seen by Stacey Curtis- PA on 12/07. She was still feeling sick the next day. She would like an excuse for Monday the 7th and Tuesday the 8th. Please call when ready she will have a fax number available to give to fax it to. She didn't know it immediately off hand. She will be ready with it when you call.   Best: [phone]

## 2014-01-02 NOTE — Telephone Encounter (Signed)
Hi Huntley DecSara,  Please extend the note per the patient's specifications. Thank you for your help.    Deliah BostonMichael Clark, MS, PA-C   9:08 PM, 01/02/2014

## 2014-01-02 NOTE — Telephone Encounter (Signed)
Stacey Curtis is this note ok to extend please advise.

## 2014-01-03 ENCOUNTER — Encounter: Payer: Self-pay | Admitting: *Deleted

## 2014-01-03 NOTE — Telephone Encounter (Signed)
Unable to leave message- if pt calls back letter is ready for pick up in drawer at 102

## 2014-12-02 ENCOUNTER — Ambulatory Visit: Payer: Self-pay | Admitting: Family Medicine

## 2014-12-03 ENCOUNTER — Ambulatory Visit: Payer: Self-pay | Admitting: Family Medicine

## 2015-02-06 ENCOUNTER — Telehealth: Payer: Self-pay

## 2015-02-06 NOTE — Telephone Encounter (Signed)
Pt left v/m; pt has discussed with Dr Patsy Lageropland in the past if needs refill on Macrobid to call and Dr Patsy Lageropland would refill; pt's urologist Dr Achilles [place] [facility] [place] [facility] and not easy to get to Dr Copes office for appt. [facility] Pt request cb. No urinary symptoms at this time.last acute visit seen 11/08/2013.Please advise.

## 2015-02-07 MED ORDER — NITROFURANTOIN MACROCRYSTAL 50 MG PO CAPS: 50.0000 mg | ORAL_CAPSULE | Freq: Every day | ORAL | Status: DC | PRN

## 2015-02-07 NOTE — Telephone Encounter (Signed)
Spoke with patient and she made appt with Dr. Achilles Dunkope in February rx sent to pharmacy by e-script

## 2015-02-07 NOTE — Telephone Encounter (Signed)
She is taking daily macrobid prophylaxis. (dr. Achilles Dunkope patient)  Generally, i would like to have her PCP involved with this longitudinal decision. I would personally be comfortable with this case, but I am uncomfortable speaking for how Dr. Alphonsus SiasLetvak would approach it.

## 2015-02-07 NOTE — Telephone Encounter (Signed)
I don't mind prescribing this but would like to avoid daily preventative medication if possible. I know Dr Achilles Dunkope started this 3 years ago, but need a more recent note to confirm he still wanted her on daily medication. For many young women, they can use the antibiotic after sex, and for 3 days twice a day if she starts with symptoms Okay to Rx #30 x 1 and she should make appt to discuss this (and after I can review Dr Cope's recent records)

## 2016-10-15 ENCOUNTER — Ambulatory Visit (INDEPENDENT_AMBULATORY_CARE_PROVIDER_SITE_OTHER): Payer: 59 | Admitting: Primary Care

## 2016-10-15 ENCOUNTER — Encounter: Payer: Self-pay | Admitting: Primary Care

## 2016-10-15 ENCOUNTER — Encounter (INDEPENDENT_AMBULATORY_CARE_PROVIDER_SITE_OTHER): Payer: Self-pay

## 2016-10-15 VITALS — BP 120/78 | HR 85 | Temp 98.9°F

## 2016-10-15 DIAGNOSIS — F329 Major depressive disorder, single episode, unspecified: Secondary | ICD-10-CM

## 2016-10-15 DIAGNOSIS — F419 Anxiety disorder, unspecified: Secondary | ICD-10-CM | POA: Diagnosis not present

## 2016-10-15 DIAGNOSIS — F32A Depression, unspecified: Secondary | ICD-10-CM | POA: Insufficient documentation

## 2016-10-15 MED ORDER — SERTRALINE HCL 50 MG PO TABS: 50.0000 mg | ORAL_TABLET | Freq: Every day | ORAL | 1 refills | Status: DC

## 2016-10-15 NOTE — Patient Instructions (Signed)
Start sertraline (Zoloft) 50 mg tablets. Start by taking 1/2 tablet daily for 8 days, then increase to 1 full tablet thereafter.  Schedule an appointment with the therapist as discussed.  Schedule a follow up visit with Dr. Alphonsus Sias or myself in 6 weeks.  Call me sooner if you have any problems.  It was a pleasure meeting you!

## 2016-10-15 NOTE — Progress Notes (Signed)
   Subjective:    Patient ID: Stacey Curtis, female    DOB: 03-24-1990, 26 y.o.   MRN: [id number]  HPI  Stacey Curtis is a 26 year old female who presents today with a chief complaint of anxiety and depression.   Recently married in June 2018, currently in graduate school, working part time in [place], recently bought a house. Over the past three months she's been experiencing symptoms of difficulty falling asleep and will wake up 5-6 times nightly, little motivation to do anything, she's over analyzing thoughts and situations in her head, she's had three panic attacks within the past three months (one panic attack required her to pull over from driving). Over the past one month she feels as though her symptoms have progressed and have been unmanageable. She's been calling out of work as she can't get motivated, this is not like her. Her family has been worried.  She denies a prior history of anxiety and depression, but has always been a "perfectionist". GAD 7 score of 21 and PHQ 9 score of 17 today. She has a family history of depression in her father and on the entire side of her father's family. She denies SI/HI.   Review of Systems  Constitutional: Positive for fatigue.  Respiratory: Negative for shortness of breath.   Cardiovascular: Negative for chest pain.  Gastrointestinal: Negative for abdominal pain and nausea.  Psychiatric/Behavioral: Positive for sleep disturbance. The patient is nervous/anxious.        See history of present illness       Past Medical History:  Diagnosis Date  . PATELLO-FEMORAL SYNDROME 08/16/2008   Qualifier: Diagnosis of  By: Patsy Lager MD, Karleen Hampshire       Social History   Social History  . Marital status: Single    Spouse name: N/A  . Number of children: N/A  . Years of education: N/A   Occupational History  . 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 Smoking status: Never Smoker  . Smokeless tobacco: Never Used  . Alcohol use  No  . Drug use: No  . Sexual activity: Not on file   Other Topics Concern  . Not on file   Social History Narrative   Starting at [employer] for [employer] (fall 2015)                Past Surgical History:  Procedure Laterality Date  . TONSILLECTOMY      No family history on file.  Allergies  Allergen Reactions  . Cephalexin Anaphylaxis  . Sulfa Antibiotics Anaphylaxis    No current outpatient prescriptions on file prior to visit.   No current facility-administered medications on file prior to visit.     BP 120/78   Pulse 85   Temp 98.9 F (37.2 C) (Oral)   LMP 09/16/2016   SpO2 99%    Objective:   Physical Exam  Constitutional: She appears well-nourished.  Neck: Neck supple.  Cardiovascular: Normal rate and regular rhythm.   Pulmonary/Chest: Effort normal and breath sounds normal.  Skin: Skin is warm and dry.  Psychiatric: She has a normal mood and affect.  Tearful at times during exam          Assessment & Plan:

## 2016-10-15 NOTE — Assessment & Plan Note (Signed)
3 month history of symptoms, increasingly worse over the past 1 month. Strong family history of depression. PH Q9 score is 17 and GAD 7 score of 21 today.  Discussed different options for treatment including therapy and medication. She has a contact number for a family therapist and plans on scheduling an appointment. She does believe that she needs additional help with medication, based off of her presentation today this would be reasonable.  Prescription for Zoloft 50 mg tablets sent to pharmacy. Patient is to take 1/2 tablet daily for 8 days, then advance to 1 full tablet thereafter. We discussed possible side effects of headache, GI upset, drowsiness, and SI/HI. If thoughts of SI/HI develop, we discussed to present to the emergency immediately. Patient verbalized understanding.   Follow-up in 6 weeks with either PCP or myself for reevaluation.

## 2016-11-19 ENCOUNTER — Ambulatory Visit (INDEPENDENT_AMBULATORY_CARE_PROVIDER_SITE_OTHER): Payer: 59 | Admitting: Primary Care

## 2016-11-19 VITALS — BP 104/70 | HR 87 | Temp 98.4°F

## 2016-11-19 DIAGNOSIS — F329 Major depressive disorder, single episode, unspecified: Secondary | ICD-10-CM

## 2016-11-19 DIAGNOSIS — F419 Anxiety disorder, unspecified: Secondary | ICD-10-CM

## 2016-11-19 DIAGNOSIS — Z23 Encounter for immunization: Secondary | ICD-10-CM

## 2016-11-19 DIAGNOSIS — F32A Depression, unspecified: Secondary | ICD-10-CM

## 2016-11-19 MED ORDER — SERTRALINE HCL 50 MG PO TABS: 50.0000 mg | ORAL_TABLET | Freq: Every day | ORAL | 1 refills | Status: DC

## 2016-11-19 MED ORDER — TRAZODONE HCL 50 MG PO TABS: 25.0000 mg | ORAL_TABLET | Freq: Every evening | ORAL | 0 refills | Status: DC | PRN

## 2016-11-19 NOTE — Patient Instructions (Signed)
Continue sertraline (Zoloft) 50 mg tablets for anxiety and depression.  You may take trazodone 50 mg tablets as needed for sleep. Start by taking 1/2 tablet-1 tablet 30 minutes prior to sleep.  Please schedule a follow up visit with me during Christmas break. Please update me if you have any problems.  It was a pleasure to see you today!

## 2016-11-19 NOTE — Progress Notes (Signed)
   Subjective:    Patient ID: Stacey Curtis, female    DOB: 1990-03-30, 26 y.o.   MRN: [id number]  HPI  Ms. Stacey Curtis is a 26 year old female who presents today for follow up of anxiety and depression.   She was last evaluated in late September with a 3 month history of symptoms of anxiety and depression, worse over the past 1 month. GAD 7 score of 21 and PHQ 9 score of 17 that day so she was recommended to start seeing a therapist and start Zoloft 50 mg.   Since her last visit she's been compliant to her Zoloft. She's started to notice a decrease in anxiety and is able to better handle stressful situations. There hasn't been a drastic change in symptoms but she's started to notice some improvement. She continues to feel tired, will wake during the night most every night with inability to fall back asleep. She's recently been taking a muscle relaxer to help her for sleep. She's tried Melatonin without much improvement.   She hasn't been able to see a therapist yet as [place]. She plans on seeing her therapist in December during Christmas break.   Review of Systems  Respiratory: Negative for shortness of breath.   Cardiovascular: Negative for chest pain.  Gastrointestinal: Negative for abdominal pain.  Neurological: Negative for headaches.  Psychiatric/Behavioral: Positive for sleep disturbance. The patient is nervous/anxious.        See HPI       Past Medical History:  Diagnosis Date  . PATELLO-FEMORAL SYNDROME 08/16/2008   Qualifier: Diagnosis of  By: Patsy Lageropland MD, Karleen HampshireSpencer       Social History   Social History  . Marital status: Single    Spouse name: N/A  . Number of children: N/A  . Years of education: N/A   Occupational History  . 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 Smoking status: Never Smoker  . Smokeless tobacco: Never Used  . Alcohol use No  . Drug use: No  . Sexual activity: Not on file   Other Topics Concern  . Not on  file   Social History Narrative   Starting at [employer] for [employer] (fall 2015)                Past Surgical History:  Procedure Laterality Date  . TONSILLECTOMY      No family history on file.  Allergies  Allergen Reactions  . Cephalexin Anaphylaxis  . Sulfa Antibiotics Anaphylaxis    Current Outpatient Prescriptions on File Prior to Visit  Medication Sig Dispense Refill  . sertraline (ZOLOFT) 50 MG tablet Take 1 tablet (50 mg total) by mouth daily. 30 tablet 1  . nitrofurantoin (MACRODANTIN) 50 MG capsule Take 100 mg by mouth.     No current facility-administered medications on file prior to visit.     BP 104/70   Pulse 87   Temp 98.4 F (36.9 C) (Oral)   LMP 10/19/2016   SpO2 97%    Objective:   Physical Exam  Constitutional: She appears well-nourished.  Neck: Neck supple.  Cardiovascular: Normal rate and regular rhythm.   Pulmonary/Chest: Effort normal and breath sounds normal.  Skin: Skin is warm and dry.  Psychiatric: She has a normal mood and affect.          Assessment & Plan:

## 2016-11-19 NOTE — Assessment & Plan Note (Signed)
Slight improvement since last visit. Continue Zoloft 50 mg for now given that she's started to notice improvement. Rx for low dose Trazodone provided to use PRN for sleep as she's failed numerous OTC medications and is now using a muscle relaxer. Will have her follow back up during Christmas break in 2 months. Denies SI/HI.

## 2016-11-19 NOTE — Addendum Note (Signed)
Addended by: Tawnya CrookSAMBATH, Rudra Hobbins on: 11/19/2016 05:09 PM   Modules accepted: Orders

## 2016-11-22 ENCOUNTER — Ambulatory Visit: Payer: 59 | Admitting: Primary Care

## 2016-12-02 ENCOUNTER — Encounter: Payer: Self-pay | Admitting: Certified Nurse Midwife

## 2016-12-02 ENCOUNTER — Ambulatory Visit: Payer: 59 | Admitting: Certified Nurse Midwife

## 2016-12-02 VITALS — BP 110/70 | HR 68 | Ht 65.0 in | Wt 146.0 lb

## 2016-12-02 DIAGNOSIS — R1032 Left lower quadrant pain: Secondary | ICD-10-CM | POA: Diagnosis not present

## 2016-12-02 DIAGNOSIS — N760 Acute vaginitis: Secondary | ICD-10-CM | POA: Diagnosis not present

## 2016-12-02 DIAGNOSIS — N939 Abnormal uterine and vaginal bleeding, unspecified: Secondary | ICD-10-CM | POA: Diagnosis not present

## 2016-12-02 DIAGNOSIS — N898 Other specified noninflammatory disorders of vagina: Secondary | ICD-10-CM

## 2016-12-02 DIAGNOSIS — B9689 Other specified bacterial agents as the cause of diseases classified elsewhere: Secondary | ICD-10-CM

## 2016-12-02 DIAGNOSIS — T192XXA Foreign body in vulva and vagina, initial encounter: Secondary | ICD-10-CM | POA: Diagnosis not present

## 2016-12-02 LAB — POCT WET PREP (WET MOUNT): Trichomonas Wet Prep HPF POC: ABSENT

## 2016-12-02 LAB — POCT URINE PREGNANCY: Preg Test, Ur: NEGATIVE

## 2016-12-02 MED ORDER — TINIDAZOLE 500 MG PO TABS: 1000.0000 mg | ORAL_TABLET | Freq: Every day | ORAL | 0 refills | Status: AC

## 2016-12-02 NOTE — Progress Notes (Signed)
Obstetrics & Gynecology Office Visit   Chief Complaint:  Chief Complaint  Patient presents with  . Abnormal bleeding    x12 days     History of Present Illness: 26 year old nulliparous female presents with complaints of abnormal LMP. Her menses are usually monthly and last 7 days, with a moderate flow and no cramping. Her LMP 11/21/2016 came at the correct time and started out with a normal flow, but she has now been bleeding for 12 days.  Her bleeding is light, but persistent and she also complains of constant cramping in her LLQ. She denies fever, rash. Has had some more headaches with this menses and has noticed a vaginal odor in the last few days. Current contraception is condoms. Denies any new sexual partners. Is in a M/M relationship Past medical history is remarkable for chronic cystitis, common migraine and anxiety and depression. She stays stressed while going to graduate school and working.    Review of Systems:  Review of Systems  Constitutional: Negative for chills and fever.  Gastrointestinal: Positive for abdominal pain. Negative for diarrhea (LLQ), nausea and vomiting.  Genitourinary: Negative for dysuria.       See HPI  Skin: Negative for itching and rash.  Neurological: Positive for headaches.  Psychiatric/Behavioral: Positive for depression. The patient is nervous/anxious and has insomnia.      Past Medical History:  Past Medical History:  Diagnosis Date  . Anxiety and depression   . Common migraine   . Nephrolithiasis   . PATELLO-FEMORAL SYNDROME 08/16/2008   Qualifier: Diagnosis of  By: Patsy Lageropland MD, Karleen HampshireSpencer      Past Surgical History:  Past Surgical History:  Procedure Laterality Date  . TONSILLECTOMY  2011    Gynecologic History: Patient's last menstrual period was 11/21/2016 (exact date).  Obstetric History: [id number]  Family History:  Family History  Problem Relation Age of Onset  . Melanoma Paternal Grandmother     Social History:  Social  History   Socioeconomic History  . Marital status: Single    Spouse name: Not on file  . Number of children: Not on file  . Years of education: Not on file  . Highest education level: Not on file  Social Needs  . Financial resource strain: Not on file  . Food insecurity - worry: Not on file  . Food insecurity - inability: Not on file  . Transportation needs - medical: Not on file  . Transportation needs - non-medical: Not on file  Occupational History  . Occupation: Warehouse managerClerical work    Comment: [employer]  Tobacco Use  . Smoking status: Never Smoker  . Smokeless tobacco: Never Used  Substance and Sexual Activity  . Alcohol use: No  . Drug use: No  . Sexual activity: Yes    Partners: Male    Birth control/protection: Condom  Other Topics Concern  . Not on file  Social History Narrative   Starting at [employer] for [employer] (fall 2015)                Allergies:  Allergies  Allergen Reactions  . Cephalexin Anaphylaxis  . Sulfa Antibiotics Anaphylaxis    Medications: Prior to Admission medications   Medication Sig Start Date End Date Taking? Authorizing Provider  nitrofurantoin (MACRODANTIN) 50 MG capsule Take 100 mg by mouth. 05/05/16  Yes [provider]  sertraline (ZOLOFT) 50 MG tablet Take 1 tablet (50 mg total) by mouth daily. 11/19/16  Yes Doreene Nestlark, Katherine K, NP  traZODone (DESYREL) 50 MG tablet Take 0.5-1 tablets (25-50 mg total) by mouth at bedtime as needed for sleep. 11/19/16  Yes Doreene Nestlark, Katherine K, NP  tinidazole (TINDAMAX) 500 MG tablet Take 2 tablets (1,000 mg total) daily with breakfast for 5 days by mouth. 12/02/16 12/07/16  Farrel ConnersGutierrez, Dawsen Krieger, CNM    Physical Exam Vitals:BP 110/70   Pulse 68   Ht 5\' 5"  (1.651 m)   Wt 146 lb (66.2 kg)   LMP 11/21/2016 (Exact Date)   BMI 24.30 kg/m  Patient's last menstrual period was 11/21/2016 (exact date).  Physical Exam  Constitutional: She is oriented to person, place, and time. She appears  well-developed and well-nourished.  GI: Soft. Normal appearance. She exhibits no distension and no mass.  Tenderness in suprapubic and LLQ. +voluntary gaurding  Genitourinary:  Genitourinary Comments: Vulva: no inflammation or exudates Vagina: tampon in vault, tampon removed with uterine forceps. Off white discharge with odor Cervix: closed, NT Uterus: voluntary guarding made it difficult to palpate uterus which was AV and NSSC Adnexa: unable to evaluate due to guarding  Neurological: She is alert and oriented to person, place, and time.  Skin: Skin is warm and dry. No rash noted. No erythema.  Psychiatric:  Very anxious and embarrassed   Results for orders placed or performed in visit on 12/02/16 (from the past 24 hour(s))  POCT urine pregnancy     Status: Normal   Collection Time: 12/02/16  2:34 PM  Result Value Ref Range   Preg Test, Ur Negative Negative  POCT Wet Prep Mellody Drown(Wet [place])     Status: Abnormal   Collection Time: 12/02/16  5:27 PM  Result Value Ref Range   Source Wet Prep POC VAGINA    WBC, Wet Prep HPF POC     Bacteria Wet Prep HPF POC  Few   BACTERIA WET PREP MORPHOLOGY POC     Clue Cells Wet Prep HPF POC Moderate (A) None   Clue Cells Wet Prep Whiff POC     Yeast Wet Prep HPF POC None    KOH Wet Prep POC     Trichomonas Wet Prep HPF POC Absent Absent     Assessment/ PLAN: 26 y.o.  With AUB, probably from retained tampon, but discussed other possible etiologies Bacterial vaginosis  Tindamax 1 GM daily x 5 days (no alcohol x 7 days) LLQ pain-unable to get an adequate pelvic exam to rule out left adnexal mass  Encouraged patient to let me know if pain persists, and will get an ultrasound ordered. RTO prn  Farrel Connersolleen Delvin Hedeen, CNM

## 2016-12-23 ENCOUNTER — Other Ambulatory Visit: Payer: Self-pay | Admitting: Primary Care

## 2016-12-23 DIAGNOSIS — F329 Major depressive disorder, single episode, unspecified: Secondary | ICD-10-CM

## 2016-12-23 DIAGNOSIS — F32A Depression, unspecified: Secondary | ICD-10-CM

## 2016-12-23 DIAGNOSIS — F419 Anxiety disorder, unspecified: Principal | ICD-10-CM

## 2017-01-06 ENCOUNTER — Ambulatory Visit: Payer: Self-pay | Admitting: Certified Nurse Midwife

## 2017-01-13 ENCOUNTER — Ambulatory Visit: Payer: 59 | Admitting: Primary Care

## 2017-01-21 ENCOUNTER — Ambulatory Visit: Payer: Self-pay | Admitting: Certified Nurse Midwife

## 2017-01-27 ENCOUNTER — Ambulatory Visit: Payer: Self-pay | Admitting: Primary Care

## 2017-02-04 ENCOUNTER — Ambulatory Visit: Payer: Self-pay | Admitting: Primary Care

## 2017-02-04 DIAGNOSIS — Z0289 Encounter for other administrative examinations: Secondary | ICD-10-CM

## 2017-02-24 ENCOUNTER — Encounter: Payer: Self-pay | Admitting: Primary Care

## 2017-02-24 ENCOUNTER — Ambulatory Visit: Payer: 59 | Admitting: Primary Care

## 2017-02-24 ENCOUNTER — Telehealth: Payer: Self-pay | Admitting: Primary Care

## 2017-02-24 DIAGNOSIS — F329 Major depressive disorder, single episode, unspecified: Secondary | ICD-10-CM | POA: Diagnosis not present

## 2017-02-24 DIAGNOSIS — F32A Depression, unspecified: Secondary | ICD-10-CM

## 2017-02-24 DIAGNOSIS — F419 Anxiety disorder, unspecified: Secondary | ICD-10-CM

## 2017-02-24 MED ORDER — SERTRALINE HCL 50 MG PO TABS: 75.0000 mg | ORAL_TABLET | Freq: Every day | ORAL | 0 refills | Status: DC

## 2017-02-24 MED ORDER — SERTRALINE HCL 100 MG PO TABS: 100.0000 mg | ORAL_TABLET | Freq: Every day | ORAL | 1 refills | Status: DC

## 2017-02-24 NOTE — Progress Notes (Signed)
Subjective:    Patient ID: Stacey Curtis, female    DOB: Jan 19, 1991, 27 y.o.   MRN: [id number]  HPI  Stacey Curtis is a 27 year old female who presents today for follow up of anxiety and depression.  She was last evaluated on 11/19/16 for follow up of anxiety and depression. During that visit she endorsed feeling less anxiety and able to handle stressful situations since she was initiated on Zoloft 50 mg. She also endorsed difficulty sleeping during that visit and was initiated on Trazodone 50 mg.   Since her last visit she's doing well on Trazodone, did reduce to 1/2 tablet due to drowsiness the following day. Overall doing well on Trazodone for which she's taking 1-2 times weekly on average.   She also thinks that her symptoms of anxiety and depression have returned and doesn't feel as though the Zoloft is helping. She's feeling tired, lack of motivation to do anything, daily worry, over thinking situations, tearfulness. She thinks the Zoloft was effective in the beginning.   GAD 7 score of 21 and PHQ 9 score of 20. She denies SI/HI. She has missed work for these symptoms and is asking for a work note excuse.  Review of Systems  Constitutional: Positive for fatigue.  Cardiovascular: Negative for palpitations.  Psychiatric/Behavioral: Negative for sleep disturbance. The patient is nervous/anxious.        See HPI       Past Medical History:  Diagnosis Date  . Anxiety and depression   . Common migraine   . Nephrolithiasis   . PATELLO-FEMORAL SYNDROME 08/16/2008   Qualifier: Diagnosis of  By: Patsy Lageropland MD, Karleen HampshireSpencer       Social History   Socioeconomic History  . Marital status: Single    Spouse name: Not on file  . Number of children: Not on file  . Years of education: Not on file  . Highest education level: Not on file  Social Needs  . Financial resource strain: Not on file  . Food insecurity - worry: Not on file  . Food insecurity - inability: Not on file  .  Transportation needs - medical: Not on file  . Transportation needs - non-medical: Not on file  Occupational History  . Occupation: Warehouse managerClerical work    Comment: [employer]  Tobacco Use  . Smoking status: Never Smoker  . Smokeless tobacco: Never Used  Substance and Sexual Activity  . Alcohol use: No  . Drug use: No  . Sexual activity: Yes    Partners: Male    Birth control/protection: Condom  Other Topics Concern  . Not on file  Social History Narrative   Starting at [employer] for [employer] (fall 2015)                Past Surgical History:  Procedure Laterality Date  . TONSILLECTOMY  2011    Family History  Problem Relation Age of Onset  . Melanoma Paternal Grandmother     Allergies  Allergen Reactions  . Cephalexin Anaphylaxis  . Sulfa Antibiotics Anaphylaxis    Current Outpatient Medications on File Prior to Visit  Medication Sig Dispense Refill  . nitrofurantoin (MACRODANTIN) 50 MG capsule Take 100 mg by mouth.    . sertraline (ZOLOFT) 50 MG tablet Take 1 tablet (50 mg total) by mouth daily. 90 tablet 1  . traZODone (DESYREL) 50 MG tablet TAKE 0.5-1 TABLETS (25-50 MG TOTAL) BY MOUTH AT BEDTIME AS NEEDED FOR SLEEP. 30 tablet 0   No current  facility-administered medications on file prior to visit.     BP 106/70   Pulse 69   Temp 98.7 F (37.1 C) (Oral)   LMP 01/23/2017   SpO2 98%    Objective:   Physical Exam  Constitutional: She appears well-nourished.  Neck: Neck supple.  Cardiovascular: Normal rate and regular rhythm.  Pulmonary/Chest: Effort normal and breath sounds normal.  Skin: Skin is warm and dry.  Psychiatric: She has a normal mood and affect.          Assessment & Plan:

## 2017-02-24 NOTE — Telephone Encounter (Signed)
Patient was charged $50 for a no show visit on 02/04/17.  Patient said she didn't know that she had the appointment. Can charge be dropped?  Please call patient. Patient aware Stacey Curtis won't be able to call her back until next week.

## 2017-02-24 NOTE — Assessment & Plan Note (Addendum)
Initial improvement on Zoloft 50 mg, now symptoms have returned. GAD 7 score of 21 and PHQ 9 score of 20 today.  Will increase dose to 100 mg gradually over the next 1 week, start with 75 mg daily, then increase to 100 mg thereafter.  Continue Trazodone 25 mg HS PRN.  Follow up in 6 weeks for re-evaluation or sooner if needed.

## 2017-02-24 NOTE — Patient Instructions (Signed)
We are increasing your Zoloft medication.  Start by taking 1.5 tablets of the 50 mg dose daily for one week, then increase to the 100 mg tablet thereafter.  Please schedule a follow up visit in 6 weeks, please message me sooner if you have any problems.  It was a pleasure to see you today!

## 2017-03-03 ENCOUNTER — Other Ambulatory Visit: Payer: Self-pay | Admitting: Primary Care

## 2017-03-03 ENCOUNTER — Telehealth: Payer: Self-pay | Admitting: Primary Care

## 2017-03-03 DIAGNOSIS — F419 Anxiety disorder, unspecified: Principal | ICD-10-CM

## 2017-03-03 DIAGNOSIS — F32A Depression, unspecified: Secondary | ICD-10-CM

## 2017-03-03 DIAGNOSIS — F329 Major depressive disorder, single episode, unspecified: Secondary | ICD-10-CM

## 2017-03-03 NOTE — Telephone Encounter (Signed)
I'm confused, see CRM. Who's her PCP? Me or Dr. Alphonsus SiasLetvak? She's supposed to be increasing her Zoloft gradually.  Has she been taking her Trazodone nightly? How much?

## 2017-03-03 NOTE — Telephone Encounter (Signed)
Copied from CRM [phone]. Topic: Quick Communication - See Telephone Encounter >> Mar 03, 2017 12:26 PM Louie BunPalacios Medina, Rosey Batheresa D wrote: CRM for notification. See Telephone encounter for: 03/03/17. Patient called and said that she has question about the doze for her sertraline (ZOLOFT) 50 MG tablet. The pharmacy gave her two type and she is not sure what she needs to take and also her traZODone (DESYREL) 50 MG tablet was not called in to her pharmacy and would like to know why. Please call patient back, thanks.

## 2017-03-03 NOTE — Telephone Encounter (Signed)
She has not seen Dr Alphonsus SiasLetvak in several years. She has been seeing Jae DireKate for Anxiety for awhile.

## 2017-03-04 MED ORDER — TRAZODONE HCL 50 MG PO TABS: 25.0000 mg | ORAL_TABLET | Freq: Every evening | ORAL | 0 refills | Status: DC | PRN

## 2017-03-04 NOTE — Telephone Encounter (Signed)
This was addressed in telephone encounter on 03/03/2017

## 2017-03-04 NOTE — Telephone Encounter (Signed)
Left message for pt for clarification. CRM created.

## 2017-03-04 NOTE — Telephone Encounter (Addendum)
Spoken to patient and notified the correct dosage of Zoloft. Patient is to take 1.5 tablet of 50 mg for 1 week and 100 mg thereafter.  [facility]

## 2017-03-04 NOTE — Telephone Encounter (Signed)
Mayra ReelKate Clark said no charge, I called pt and let her know and sent email to charge correction.

## 2017-04-04 ENCOUNTER — Ambulatory Visit: Payer: Self-pay | Admitting: Certified Nurse Midwife

## 2017-04-04 ENCOUNTER — Ambulatory Visit: Payer: 59 | Admitting: Primary Care

## 2017-04-05 ENCOUNTER — Ambulatory Visit: Payer: 59 | Admitting: Nurse Practitioner

## 2017-04-05 ENCOUNTER — Encounter: Payer: Self-pay | Admitting: Nurse Practitioner

## 2017-04-05 ENCOUNTER — Ambulatory Visit (INDEPENDENT_AMBULATORY_CARE_PROVIDER_SITE_OTHER): Payer: 59

## 2017-04-05 VITALS — BP 106/60 | HR 68 | Temp 98.1°F | Ht 65.0 in

## 2017-04-05 DIAGNOSIS — R05 Cough: Secondary | ICD-10-CM | POA: Diagnosis not present

## 2017-04-05 DIAGNOSIS — J209 Acute bronchitis, unspecified: Secondary | ICD-10-CM

## 2017-04-05 DIAGNOSIS — J9801 Acute bronchospasm: Secondary | ICD-10-CM

## 2017-04-05 DIAGNOSIS — R0602 Shortness of breath: Secondary | ICD-10-CM

## 2017-04-05 LAB — POCT URINE PREGNANCY: Preg Test, Ur: NEGATIVE

## 2017-04-05 MED ORDER — ALBUTEROL SULFATE HFA 108 (90 BASE) MCG/ACT IN AERS: 1.0000 | INHALATION_SPRAY | Freq: Four times a day (QID) | RESPIRATORY_TRACT | 0 refills | Status: DC | PRN

## 2017-04-05 MED ORDER — DM-GUAIFENESIN ER 30-600 MG PO TB12: 1.0000 | ORAL_TABLET | Freq: Two times a day (BID) | ORAL | 0 refills | Status: DC | PRN

## 2017-04-05 MED ORDER — BENZONATATE 100 MG PO CAPS: 100.0000 mg | ORAL_CAPSULE | Freq: Three times a day (TID) | ORAL | 0 refills | Status: DC | PRN

## 2017-04-05 MED ORDER — IPRATROPIUM-ALBUTEROL 0.5-2.5 (3) MG/3ML IN SOLN
3.0000 mL | Freq: Four times a day (QID) | RESPIRATORY_TRACT | Status: DC
  Administered 2017-04-05: 3 mL via RESPIRATORY_TRACT

## 2017-04-05 MED ORDER — PREDNISONE 10 MG (21) PO TBPK: ORAL_TABLET | ORAL | 0 refills | Status: DC

## 2017-04-05 NOTE — Progress Notes (Signed)
Subjective:  Patient ID: Stacey Curtis, female    DOB: 02/03/1990  Age: 27 y.o. MRN: [id number]  CC: Cough (dry cough last 2 weeks/ pain in ribs/OTC not working)  Cough  This is a new problem. The current episode started 1 to 4 weeks ago. The problem has been gradually worsening. The cough is non-productive. Associated symptoms include chills, a fever, nasal congestion, postnasal drip, rhinorrhea and shortness of breath. Pertinent negatives include no sore throat, sweats, weight loss or wheezing. The symptoms are aggravated by lying down and cold air. She has tried OTC cough suppressant for the symptoms. The treatment provided no relief.   Sexually active, no contraceptive use at this time.  Outpatient Medications Prior to Visit  Medication Sig Dispense Refill  . nitrofurantoin (MACRODANTIN) 50 MG capsule Take 100 mg by mouth.    . sertraline (ZOLOFT) 100 MG tablet Take 1 tablet (100 mg total) by mouth daily. 30 tablet 1  . sertraline (ZOLOFT) 50 MG tablet Take 1.5 tablets (75 mg total) by mouth daily. 11 tablet 0  . traZODone (DESYREL) 50 MG tablet Take 0.5-1 tablets (25-50 mg total) by mouth at bedtime as needed for sleep. (Patient not taking: Reported on 04/05/2017) 30 tablet 0   No facility-administered medications prior to visit.     ROS See HPI  Objective:  BP 106/60 (BP Location: Right Arm, Patient Position: Sitting, Cuff Size: Normal)   Pulse 68   Temp 98.1 F (36.7 C) (Oral)   Ht 5\' 5"  (1.651 m)   LMP 03/18/2017 Comment: Negative pregnancy test  SpO2 98%   BMI 24.30 kg/m   BP Readings from Last 3 Encounters:  04/05/17 106/60  02/24/17 106/70  12/02/16 110/70    Wt Readings from Last 3 Encounters:  12/02/16 146 lb (66.2 kg)  12/31/13 135 lb 9.6 oz (61.5 kg)  11/08/13 136 lb 12 oz (62 kg)    Physical Exam  Constitutional: She is oriented to person, place, and time. No distress.  HENT:  Right Ear: Tympanic membrane, external ear and ear canal normal.   Left Ear: Tympanic membrane, external ear and ear canal normal.  Nose: Mucosal edema and rhinorrhea present. Right sinus exhibits no maxillary sinus tenderness and no frontal sinus tenderness. Left sinus exhibits no maxillary sinus tenderness and no frontal sinus tenderness.  Mouth/Throat: Uvula is midline. No trismus in the jaw. Posterior oropharyngeal erythema present. No oropharyngeal exudate.  Eyes: No scleral icterus.  Neck: Normal range of motion. Neck supple.  Cardiovascular: Normal rate and normal heart sounds.  Pulmonary/Chest: Effort normal and breath sounds normal. No respiratory distress. She has no wheezes. She has no rales.  Musculoskeletal: She exhibits no edema.  Lymphadenopathy:    She has no cervical adenopathy.  Neurological: She is alert and oriented to person, place, and time.  Vitals reviewed.   Lab Results  Component Value Date   WBC 6.7 09/05/2012   HGB 13.4 09/05/2012   HCT 40.4 09/05/2012   PLT 245.0 09/05/2012   GLUCOSE 97 09/05/2012   ALT 12 09/05/2012   AST 17 09/05/2012   NA 137 09/05/2012   K 4.0 09/05/2012   CL 105 09/05/2012   CREATININE 0.8 09/05/2012   BUN 11 09/05/2012   CO2 24 09/05/2012   TSH 3.00 09/05/2012    Assessment & Plan:   Stacey EvansCatherine was seen today for cough.  Diagnoses and all orders for this visit:  Acute bronchitis, unspecified organism -     ipratropium-albuterol (DUONEB) 0.5-2.5 (  3) MG/3ML nebulizer solution 3 mL -     DG Chest 2 View; Future -     dextromethorphan-guaiFENesin (MUCINEX DM) 30-600 MG 12hr tablet; Take 1 tablet by mouth 2 (two) times daily as needed for cough. -     benzonatate (TESSALON) 100 MG capsule; Take 1 capsule (100 mg total) by mouth 3 (three) times daily as needed for cough. -     albuterol (PROVENTIL HFA;VENTOLIN HFA) 108 (90 Base) MCG/ACT inhaler; Inhale 1-2 puffs into the lungs every 6 (six) hours as needed. -     POCT urine pregnancy -     DG Chest 2 View -     predniSONE (STERAPRED UNI-PAK  21 TAB) 10 MG (21) TBPK tablet; As directed on package  SOB (shortness of breath) -     DG Chest 2 View; Future -     dextromethorphan-guaiFENesin (MUCINEX DM) 30-600 MG 12hr tablet; Take 1 tablet by mouth 2 (two) times daily as needed for cough. -     benzonatate (TESSALON) 100 MG capsule; Take 1 capsule (100 mg total) by mouth 3 (three) times daily as needed for cough. -     albuterol (PROVENTIL HFA;VENTOLIN HFA) 108 (90 Base) MCG/ACT inhaler; Inhale 1-2 puffs into the lungs every 6 (six) hours as needed. -     POCT urine pregnancy -     DG Chest 2 View -     predniSONE (STERAPRED UNI-PAK 21 TAB) 10 MG (21) TBPK tablet; As directed on package   I am having Stacey Curtis start on dextromethorphan-guaiFENesin, benzonatate, albuterol, and predniSONE. I am also having her maintain her nitrofurantoin, sertraline, sertraline, and traZODone. We administered ipratropium-albuterol. We will continue to administer ipratropium-albuterol.  Meds ordered this encounter  Medications  . ipratropium-albuterol (DUONEB) 0.5-2.5 (3) MG/3ML nebulizer solution 3 mL  . dextromethorphan-guaiFENesin (MUCINEX DM) 30-600 MG 12hr tablet    Sig: Take 1 tablet by mouth 2 (two) times daily as needed for cough.    Dispense:  14 tablet    Refill:  0    Order Specific Question:   Supervising Provider    Answer:   Dianne Dun [[id number]]  . benzonatate (TESSALON) 100 MG capsule    Sig: Take 1 capsule (100 mg total) by mouth 3 (three) times daily as needed for cough.    Dispense:  20 capsule    Refill:  0    Order Specific Question:   Supervising Provider    Answer:   Dianne Dun [[id number]]  . albuterol (PROVENTIL HFA;VENTOLIN HFA) 108 (90 Base) MCG/ACT inhaler    Sig: Inhale 1-2 puffs into the lungs every 6 (six) hours as needed.    Dispense:  1 Inhaler    Refill:  0    Order Specific Question:   Supervising Provider    Answer:   Dianne Dun [[id number]]  . predniSONE (STERAPRED UNI-PAK 21 TAB) 10 MG (21) TBPK  tablet    Sig: As directed on package    Dispense:  21 tablet    Refill:  0    Order Specific Question:   Supervising Provider    Answer:   Dianne Dun [[id number]]    Follow-up: Return if symptoms worsen or fail to improve.  Stacey Penna, NP

## 2017-04-05 NOTE — Patient Instructions (Addendum)
Normal CXR. Negative urine pregnancy.  Adequate oral hydration.

## 2017-04-07 ENCOUNTER — Telehealth: Payer: Self-pay | Admitting: Nurse Practitioner

## 2017-04-07 DIAGNOSIS — J209 Acute bronchitis, unspecified: Secondary | ICD-10-CM

## 2017-04-07 MED ORDER — PROMETHAZINE-DM 6.25-15 MG/5ML PO SYRP: 5.0000 mL | ORAL_SOLUTION | Freq: Three times a day (TID) | ORAL | 0 refills | Status: DC | PRN

## 2017-04-07 MED ORDER — AZITHROMYCIN 250 MG PO TABS: 250.0000 mg | ORAL_TABLET | Freq: Every day | ORAL | 0 refills | Status: DC

## 2017-04-07 NOTE — Telephone Encounter (Signed)
[facility] is [facility].

## 2017-04-07 NOTE — Telephone Encounter (Signed)
Ok to send promethazine DM, 5ml every 8hrsprn, total, no refill

## 2017-04-07 NOTE — Telephone Encounter (Signed)
Copied from CRM [phone]. Topic: Quick Communication - See Telephone Encounter >> Apr 07, 2017  9:55 AM Arlyss Gandyichardson, Lempi Edwin N, NT wrote: CRM for notification. See Telephone encounter for: Pt saw Alysia PennaCharlotte Nche on Tuesday and was advised to call back if she wasn't feeling any better and pt states she feels worse than she did on Tuesday when she came in. She believes she needs a antibiotic and a cough syrup called in to help her. [facility] [address]   04/07/17.

## 2017-04-07 NOTE — Telephone Encounter (Signed)
Pt is aware and taking prednisone as directed on the package.   Pt was wondering if charlotte can give her something else for the cough, benzonatate didn't work at all. Please advise, will call pt back

## 2017-04-11 NOTE — Telephone Encounter (Signed)
Phone: [phone]  Pt was not able to get promethazine from pharmacy until Sunday. [facility] back[facility] Pt is still feeling badly and coughing. She would like to know if she can get a note to return to work tomorrow as the cough syrup knocks her out. Pt would like to pick up this afternoon b/w now and 4pm, please call.

## 2017-04-11 NOTE — Telephone Encounter (Signed)
Pt is aware.  

## 2017-04-11 NOTE — Telephone Encounter (Signed)
I will not be extending work note

## 2017-04-12 ENCOUNTER — Encounter: Payer: Self-pay | Admitting: Primary Care

## 2017-04-12 ENCOUNTER — Ambulatory Visit: Payer: 59 | Admitting: Primary Care

## 2017-04-12 VITALS — BP 114/70 | HR 82 | Temp 98.2°F

## 2017-04-12 DIAGNOSIS — J209 Acute bronchitis, unspecified: Secondary | ICD-10-CM

## 2017-04-12 MED ORDER — GUAIFENESIN-CODEINE 100-10 MG/5ML PO SYRP: 5.0000 mL | ORAL_SOLUTION | Freq: Three times a day (TID) | ORAL | 0 refills | Status: DC | PRN

## 2017-04-12 NOTE — Patient Instructions (Signed)
You may take the Cheratussin cough suppressant three times daily as needed for cough and rest. Caution this medication contains codeine and will make you feel drowsy.  Use the albuterol inhaler every 6 hours for persistent cough.  You should be getting better within the next 3-4 days. Sometimes a cough can linger up to two weeks post infection.  Please call me if you develop fevers, start coughing up green mucous.  It was a pleasure to see you today!

## 2017-04-12 NOTE — Progress Notes (Signed)
Subjective:    Patient ID: Stacey Curtis, female    DOB: [birth year], 27 y.o.   MRN: [id number]  HPI  Ms. Stacey Curtis is a 27 year old female who presents today with a chief complaint of cough.   She was evaluated on 04/05/17 with a two week history of cough, chills, fevers, nasal congestion, PND, rhinorrhea, SOB. She was diagnosed with acute bronchitis and treated with Duoneb, benzonatate capsules, albuterol inhaler, prednisone course. She called back several days later and was prescribed Azithromycin.   [facility] she continues to cough persistently. She completed her Azithromycin two days ago. Her cough is worse when laying flat at night. She's taking Sudafed, albuterol inhaler HS. She denies fevers. The promethazine-dextromethorphan causes drowsiness, she's stopped using benzonatate capsules as they are not effective. She completed the prednisone course.  Review of Systems  Constitutional: Negative for chills, fatigue and fever.  HENT: Positive for congestion. Negative for ear pain, sinus pressure and sore throat.   Respiratory: Positive for cough. Negative for shortness of breath and wheezing.        Past Medical History:  Diagnosis Date  . Anxiety and depression   . Common migraine   . Nephrolithiasis   . PATELLO-FEMORAL SYNDROME 08/16/2008   Qualifier: Diagnosis of  By: Patsy Lageropland MD, Karleen HampshireSpencer       Social History   Socioeconomic History  . Marital status: Single    Spouse name: Not on file  . Number of children: Not on file  . Years of education: Not on file  . Highest education level: Not on file  Social Needs  . Financial resource strain: Not on file  . Food insecurity - worry: Not on file  . Food insecurity - inability: Not on file  . Transportation needs - medical: Not on file  . Transportation needs - non-medical: Not on file  Occupational History  . Occupation: Warehouse managerClerical work    Comment: [employer]  Tobacco Use  . Smoking  status: Never Smoker  . Smokeless tobacco: Never Used  Substance and Sexual Activity  . Alcohol use: No  . Drug use: No  . Sexual activity: Yes    Partners: Male    Birth control/protection: Condom  Other Topics Concern  . Not on file  Social History Narrative   Starting at [employer] for [employer] (fall 2015)                Past Surgical History:  Procedure Laterality Date  . TONSILLECTOMY  2011    Family History  Problem Relation Age of Onset  . Melanoma Paternal Grandmother     Allergies  Allergen Reactions  . Cephalexin Anaphylaxis  . Sulfa Antibiotics Anaphylaxis    Current Outpatient Medications on File Prior to Visit  Medication Sig Dispense Refill  . albuterol (PROVENTIL HFA;VENTOLIN HFA) 108 (90 Base) MCG/ACT inhaler Inhale 1-2 puffs into the lungs every 6 (six) hours as needed. 1 Inhaler 0  . dextromethorphan-guaiFENesin (MUCINEX DM) 30-600 MG 12hr tablet Take 1 tablet by mouth 2 (two) times daily as needed for cough. 14 tablet 0  . nitrofurantoin (MACRODANTIN) 50 MG capsule Take 100 mg by mouth.    . promethazine-dextromethorphan (PROMETHAZINE-DM) 6.25-15 MG/5ML syrup Take 5 mLs by mouth 3 (three) times daily as needed for cough. 120 mL 0  . sertraline (ZOLOFT) 100 MG tablet Take 1 tablet (100 mg total) by mouth daily. 30 tablet 1  . sertraline (ZOLOFT) 50 MG tablet Take 1.5  tablets (75 mg total) by mouth daily. 11 tablet 0  . traZODone (DESYREL) 50 MG tablet Take 0.5-1 tablets (25-50 mg total) by mouth at bedtime as needed for sleep. 30 tablet 0   Current Facility-Administered Medications on File Prior to Visit  Medication Dose Route Frequency Provider Last Rate Last Dose  . ipratropium-albuterol (DUONEB) 0.5-2.5 (3) MG/3ML nebulizer solution 3 mL  3 mL Nebulization Q6H Nche, Bonna Gains, NP   3 mL at 04/05/17 1400    BP 114/70   Pulse 82   Temp 98.2 F (36.8 C) (Oral)   LMP 03/18/2017 Comment: Negative pregnancy test  SpO2 98%    Objective:    Physical Exam  Constitutional: She appears well-nourished.  HENT:  Right Ear: Tympanic membrane and ear canal normal.  Left Ear: Tympanic membrane and ear canal normal.  Nose: Right sinus exhibits no maxillary sinus tenderness and no frontal sinus tenderness. Left sinus exhibits no maxillary sinus tenderness and no frontal sinus tenderness.  Mouth/Throat: Oropharynx is clear and moist.  Eyes: Conjunctivae are normal.  Neck: Neck supple.  Cardiovascular: Normal rate and regular rhythm.  Pulmonary/Chest: Effort normal and breath sounds normal. She has no wheezes. She has no rales.  Dry cough during exam  Lymphadenopathy:    She has no cervical adenopathy.  Skin: Skin is warm and dry.          Assessment & Plan:  Post infectious cough:  Diagnosed and treated for acute bronchitis last week, completed all medication that was prescribed, continues to cough. Exam today with clear lungs, doesn't appear sickly. Suspect post infectious cough. Switch to Cheratussin AC as this may cause less drowsiness. Discussed to use albuterol q 6 hours for persistent cough. Reassurance provided that symptoms should improve within the next 3-4 days.  Return precautions provided.  Doreene Nest, NP

## 2017-04-18 ENCOUNTER — Ambulatory Visit: Payer: 59 | Admitting: Primary Care

## 2017-04-22 ENCOUNTER — Ambulatory Visit: Payer: 59 | Admitting: Certified Nurse Midwife

## 2017-04-22 ENCOUNTER — Encounter: Payer: Self-pay | Admitting: Certified Nurse Midwife

## 2017-04-22 VITALS — BP 118/78 | HR 80 | Ht 65.0 in | Wt 140.0 lb

## 2017-04-22 DIAGNOSIS — N926 Irregular menstruation, unspecified: Secondary | ICD-10-CM

## 2017-04-22 LAB — POCT URINE PREGNANCY: Preg Test, Ur: NEGATIVE

## 2017-04-22 NOTE — Progress Notes (Signed)
Obstetrics & Gynecology Office Visit   Chief Complaint:  Chief Complaint  Patient presents with  . Gynecologic Exam    very short cycle this month 4 days light usually 7 days heavy    History of Present Illness: 27 year old WMF, G0, presents with complaints of a menses that lasted only 4 days and was light flow. Her LMP=18 April 2017 and was on time. Her menses are usually every 28 days lasting 7 days. She usually does not have any dysmenorrhea, but she had cramping on the first day of this cycle. She is feeling nauseous and reports that her breasts itched but were non tender. No diarrhea. This month she was ill with bronchitis and was taking antibiotics. Contraception: condoms sometimes. She took a home pregnancy test and that was negative. Her medical history is also remarkable for anxiety and depression and she currently takes 100 mgm Zoloft daily.  Review of Systems:  Review of Systems  Constitutional: Negative for chills and fever.  Gastrointestinal: Positive for nausea. Negative for abdominal pain, diarrhea and vomiting.  Genitourinary: Negative for dysuria.  Skin: Positive for itching (breasts).  Psychiatric/Behavioral: The patient is nervous/anxious.      Past Medical History:  Past Medical History:  Diagnosis Date  . Anxiety and depression   . Common migraine   . Nephrolithiasis   . PATELLO-FEMORAL SYNDROME 08/16/2008   Qualifier: Diagnosis of  By: Patsy Lageropland MD, Karleen HampshireSpencer      Past Surgical History:  Past Surgical History:  Procedure Laterality Date  . TONSILLECTOMY  2011    Gynecologic History: Patient's last menstrual period was 04/18/2017.  Obstetric History: G0P0000  Family History:  Family History  Problem Relation Age of Onset  . Melanoma Paternal Grandmother     Social History:  Social History   Socioeconomic History  . Marital status: Single    Spouse name: Not on file  . Number of children: Not on file  . Years of education: Not on file  .  Highest education level: Not on file  Occupational History  . Occupation: Warehouse managerClerical work    Comment: [employer]  Social Needs  . Financial resource strain: Not on file  . Food insecurity:    Worry: Not on file    Inability: Not on file  . Transportation needs:    Medical: Not on file    Non-medical: Not on file  Tobacco Use  . Smoking status: Never Smoker  . Smokeless tobacco: Never Used  Substance and Sexual Activity  . Alcohol use: No  . Drug use: No  . Sexual activity: Yes    Partners: Male    Birth control/protection: Condom  Lifestyle  . Physical activity:    Days per week: 2 days    Minutes per session: 30 min  . Stress: Rather much  Relationships  . Social connections:    Talks on phone: More than three times a week    Gets together: Twice a week    Attends religious service: Never    Active member of club or organization: Yes    Attends meetings of clubs or organizations: More than 4 times per year    Relationship status: Married  . Intimate partner violence:    Fear of current or ex partner: No    Emotionally abused: No    Physically abused: No    Forced sexual activity: No  Other Topics Concern  . Not on file  Social History Narrative   Starting at  [employer] for [employer] (fall 2015)                Allergies:  Allergies  Allergen Reactions  . Cephalexin Anaphylaxis  . Sulfa Antibiotics Anaphylaxis    Medications:  Current Outpatient Medications on File Prior to Visit  Medication Sig Dispense Refill  . nitrofurantoin (MACRODANTIN) 50 MG capsule Take 100 mg by mouth.    . sertraline (ZOLOFT) 100 MG tablet Take 1 tablet (100 mg total) by mouth daily. 30 tablet 1  . albuterol (PROVENTIL HFA;VENTOLIN HFA) 108 (90 Base) MCG/ACT inhaler Inhale 1-2 puffs into the lungs every 6 (six) hours as needed. (Patient not taking: Reported on 04/22/2017) 1 Inhaler 0  . dextromethorphan-guaiFENesin (MUCINEX DM) 30-600 MG 12hr tablet Take 1 tablet by mouth 2 (two)  times daily as needed for cough. (Patient not taking: Reported on 04/22/2017) 14 tablet 0  . guaiFENesin-codeine (ROBITUSSIN AC) 100-10 MG/5ML syrup Take 5 mLs by mouth 3 (three) times daily as needed for cough. (Patient not taking: Reported on 04/22/2017) 75 mL 0  . promethazine-dextromethorphan (PROMETHAZINE-DM) 6.25-15 MG/5ML syrup Take 5 mLs by mouth 3 (three) times daily as needed for cough. (Patient not taking: Reported on 04/22/2017) 120 mL 0  . traZODone (DESYREL) 50 MG tablet Take 0.5-1 tablets (25-50 mg total) by mouth at bedtime as needed for sleep. 30 tablet 0   Current Facility-Administered Medications on File Prior to Visit  Medication Dose Route Frequency Provider Last Rate Last Dose  . ipratropium-albuterol (DUONEB) 0.5-2.5 (3) MG/3ML nebulizer solution 3 mL  3 mL Nebulization Q6H Nche, Bonna Gains, NP   3 mL at 04/05/17 1400   Physical Exam Vitals:BP 118/78 (BP Location: Left Arm, Patient Position: Sitting, Cuff Size: Normal)   Pulse 80   Ht 5\' 5"  (1.651 m)   Wt 140 lb (63.5 kg)   LMP 04/18/2017   BMI 23.30 kg/m     Physical Exam  Constitutional: She is oriented to person, place, and time. She appears well-developed and well-nourished. No distress.  Cardiovascular: Normal rate.  Respiratory: Effort normal.  Musculoskeletal: Normal range of motion.  Neurological: She is alert and oriented to person, place, and time.  Psychiatric:  Anxious, rapid speech, normal behavior   Results for orders placed or performed in visit on 04/22/17 (from the past 24 hour(s))  POCT urine pregnancy     Status: Normal   Collection Time: 04/22/17  4:20 PM  Result Value Ref Range   Preg Test, Ur Negative Negative    Assessment/Plan: 27 y.o. G0P0000 with an abnormally short menses. May be due to an anovulatory cycle.  If continues to be nauseous, repeat home UPT in 1 week. Follow up at time of annual 05/05/2017

## 2017-04-28 ENCOUNTER — Other Ambulatory Visit: Payer: Self-pay | Admitting: Nurse Practitioner

## 2017-04-28 DIAGNOSIS — J209 Acute bronchitis, unspecified: Secondary | ICD-10-CM

## 2017-04-28 DIAGNOSIS — R0602 Shortness of breath: Secondary | ICD-10-CM

## 2017-05-01 ENCOUNTER — Other Ambulatory Visit: Payer: Self-pay | Admitting: Primary Care

## 2017-05-01 DIAGNOSIS — F419 Anxiety disorder, unspecified: Principal | ICD-10-CM

## 2017-05-01 DIAGNOSIS — F32A Depression, unspecified: Secondary | ICD-10-CM

## 2017-05-01 DIAGNOSIS — F329 Major depressive disorder, single episode, unspecified: Secondary | ICD-10-CM

## 2017-05-02 NOTE — Telephone Encounter (Signed)
How's she doing on the increased dose of Zoloft 100 mg? Any improvement?

## 2017-05-02 NOTE — Telephone Encounter (Signed)
Ok to refill? Electronically refill request for sertraline (ZOLOFT) 100 MG tablet  Last prescribed on 02/24/2017. Patient no show the follow up appt on 04/18/2017.

## 2017-05-03 NOTE — Telephone Encounter (Signed)
Message left for patient to return my call.  

## 2017-05-05 ENCOUNTER — Ambulatory Visit (INDEPENDENT_AMBULATORY_CARE_PROVIDER_SITE_OTHER): Payer: 59 | Admitting: Certified Nurse Midwife

## 2017-05-05 ENCOUNTER — Encounter: Payer: Self-pay | Admitting: Certified Nurse Midwife

## 2017-05-05 VITALS — BP 110/70 | HR 82 | Ht 65.0 in

## 2017-05-05 DIAGNOSIS — Z01419 Encounter for gynecological examination (general) (routine) without abnormal findings: Secondary | ICD-10-CM | POA: Diagnosis not present

## 2017-05-05 DIAGNOSIS — Z124 Encounter for screening for malignant neoplasm of cervix: Secondary | ICD-10-CM | POA: Diagnosis not present

## 2017-05-05 NOTE — Progress Notes (Signed)
Gynecology Annual Exam  PCP: Karie Schwalbe, MD  Chief Complaint:  Chief Complaint  Patient presents with  . Gynecologic Exam    History of Present Illness:26 y/o G0 (LMP 04/18/2017) presents for a routine gyn exam.  Her periods are regular and occur qmonth and with no intermenstrual bleeding. She states that approximately 7 days with 2 heavier days requiring tampon change every 2 hours and are with small clots. She has dysmenorrhea x 2 days, and will occasionally use Midol or Tylenol with relief.  She is graduating from graduate school next month, and she would like to conceive in the near future. Stopped using condoms this past month. Is not taking vitamins  .Last Pap smear 12/12/2013; results were NIL. The patient's past medical history is remarkable for chronic cystitis (takes Macrobid postcoitally), common migraine, anxiety/depression (currently on Zoloft 100 mgm daily), and nephrolithiasis. She recently was treated for bronchitis   Patient states she got 2 out of the 3 Gardasil shots in HS.     The patient does perform self breast exams. Her last mammogram was NA.  There is no family history of breast cancer.  There is no family history of ovarian cancer.  The patient denies smoking.  She reports drinking alcohol. She reports drinking occasionally on the weekends.  She denies illegal drug use.  The patient reports exercising regularly. She runs 2-3x/week  She does get adequate calcium in her diet.  She has not had a recent cholesterol screen.  Review of Systems: Review of Systems  Constitutional: Negative for chills, fever and weight loss.  HENT: Positive for congestion (nasal congestion and sneezing). Negative for sinus pain and sore throat.   Eyes: Negative for blurred vision and pain.  Respiratory: Positive for cough. Negative for hemoptysis, shortness of breath and wheezing.   Cardiovascular: Negative for chest pain, palpitations and leg swelling.    Gastrointestinal: Negative for abdominal pain, blood in stool, diarrhea, heartburn, nausea and vomiting.  Genitourinary: Negative for dysuria, frequency, hematuria and urgency.  Musculoskeletal: Negative for back pain, joint pain and myalgias.  Skin: Negative for itching and rash.  Neurological: Negative for dizziness, tingling and headaches.  Endo/Heme/Allergies: Positive for environmental allergies. Negative for polydipsia. Does not bruise/bleed easily.       Negative for hirsutism   Psychiatric/Behavioral: Negative for depression. The patient is nervous/anxious. The patient does not have insomnia.     Past Medical History:  Past Medical History:  Diagnosis Date  . Anxiety and depression   . Common migraine   . Nephrolithiasis   . 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 Recurrent cystitis     Past Surgical History:  Past Surgical History:  Procedure Laterality Date  . TONSILLECTOMY  2011    Family History:  Family History  Problem Relation Age of Onset  . Cardiomyopathy Father   . Cardiomyopathy Paternal Grandfather   . Melanoma Paternal Grandmother   . Diabetes Neg Hx   . Hypertension Neg Hx   . Stroke Neg Hx   . Thyroid disease Neg Hx     Social History:  Social History   Socioeconomic History  . Marital status: Married    Spouse name: Not on file  . Number of children: 0  . Years of education: Not on file  . Highest education level: Not on file  Occupational History  . Occupation: Gaffer    Comment: [employer]  .  Occupation: Passenger transport manager  Social Needs  . Financial resource strain: Not on file  . Food insecurity:    Worry: Not on file    Inability: Not on file  . Transportation needs:    Medical: Not on file    Non-medical: Not on file  Tobacco Use  . Smoking status: Never Smoker  . Smokeless tobacco: Never Used  Substance and Sexual Activity  . Alcohol use: Yes    Comment:  occasional  . Drug use: No  . Sexual activity: Yes    Partners: Male    Birth control/protection: None  Lifestyle  . Physical activity:    Days per week: 2 days    Minutes per session: 30 min  . Stress: Rather much  Relationships  . Social connections:    Talks on phone: More than three times a week    Gets together: Twice a week    Attends religious service: Never    Active member of club or organization: Yes    Attends meetings of clubs or organizations: More than 4 times per year    Relationship status: Married  . Intimate partner violence:    Fear of current or ex partner: No    Emotionally abused: No    Physically abused: No    Forced sexual activity: No  Other Topics Concern  . Not on file  Social History Narrative   Starting at [employer] for [employer] (fall 2015)                Allergies:  Allergies  Allergen Reactions  . Cephalexin Anaphylaxis  . Sulfa Antibiotics Anaphylaxis    Medications:  Current Outpatient Medications on File Prior to Visit  Medication Sig Dispense Refill  . nitrofurantoin (MACRODANTIN) 50 MG capsule Take 100 mg by mouth.    . sertraline (ZOLOFT) 100 MG tablet Take 1 tablet (100 mg total) by mouth daily. 30 tablet 1  . traZODone (DESYREL) 50 MG tablet Take 0.5-1 tablets (25-50 mg total) by mouth at bedtime as needed for sleep. 30 tablet 0   Current Facility-Administered Medications on File Prior to Visit  Medication Dose Route Frequency Provider Last Rate Last Dose  . ipratropium-albuterol (DUONEB) 0.5-2.5 (3) MG/3ML nebulizer solution 3 mL  3 mL Nebulization Q6H Nche, Bonna Gains, NP   3 mL at 04/05/17 1400   Physical Exam Vitals: BP 110/70   Pulse 82   Ht 5\' 5"  (1.651 m)   LMP 04/18/2017   BMI 23.30 kg/m   General: WF in NAD HEENT: normocephalic, anicteric Neck: no thyroid enlargement, no palpable nodules, no cervical lymphadenopathy  Pulmonary: No increased work of breathing, CTAB Cardiovascular: RRR, without murmur    Breast: Breast symmetrical, no tenderness, no palpable nodules or masses, no skin or nipple retraction present, no nipple discharge.  No axillary, infraclavicular or supraclavicular lymphadenopathy. Abdomen: Soft, non-tender, non-distended.  Umbilicus without lesions.  No hepatomegaly or masses palpable. No evidence of hernia. Genitourinary:  External: Normal external female genitalia.  Normal urethral meatus, normal  Bartholin's and Skene's glands.    Vagina: Normal vaginal mucosa, no evidence of prolapse.    Cervix: Grossly normal in appearance, no bleeding, non-tender, posterior  Uterus: Anteverted, deviated to left, normal size, shape, and consistency, mobile, and non-tender  Adnexa: No adnexal masses, non-tender. Exam limited by voluntary guarding/ anxiety  Rectal: deferred  Lymphatic: no evidence of inguinal lymphadenopathy Extremities: no edema, erythema, or tenderness Neurologic: Grossly intact Psychiatric: mood appropriate, affect full, anxious  Assessment: 27 y.o. G0P0000 normal gyn exam Desires pregnancy IUD removed  Plan:   1) Breast cancer screening - recommend monthly self breast exam.   2) Discussed starting to take a multivitamin or prenatal vitamin, or just folic acid 400 mcg daily to reduce risk of NTD.   3) Cervical cancer screening - Pap was done.    4) RTO 1 year and prn.  Farrel Connersolleen Tommie Bohlken, CNM

## 2017-05-05 NOTE — Patient Instructions (Signed)
Start prenatal vitamins Folic Acid and Pregnancy What is folic acid? Folic acid is a B vitamin. Your body needs it to make new cells. Folic acid is also called folate. Folate is the form of the B vitamin that is found naturally in food. Folic acid is the artificial (synthetic) form of the B vitamin. Folic acid is added to certain foods (fortified foods) and is also available in dietary supplements such as prenatal vitamins. All women who may become pregnant or are planning to become pregnant need at least 400-800 mcg of folic acid daily. Most pregnant women need 600-800 mcg of folic acid per day, but some women need more. What are the benefits of taking folic acid during pregnancy? Taking folic acid during pregnancy helps to prevent abnormalities that can develop in an unborn baby's brain, spine, or spinal column (neural tube defects). These defects include:  Spina bifida. This is when the spinal column does not close completely during development, leaving the spinal cord exposed. This means the nerves that control leg movements and other bodily functions do not work. Spina bifida causes lifelong disabilities.  Anencephaly. Babies born with anencephaly have an underdeveloped brain. They may have little or no brain matter, and they could also be missing parts of the skull.  Neural tube defects occur in the first few months (first trimester) of pregnancy. If you are trying to get pregnant, make sure you get enough folic acid for at least one month before you start trying. It is important to get enough folic acid even if you are not trying to get pregnant, because some pregnancies are unplanned.If your pregnancy is unplanned, start taking folic acid as soon as you find out that you are pregnant. Folic acid can also help to prevent a drop in red blood cells that carry oxygen throughout the body (anemia). Anemia during pregnancy is associated with complications such as low birth weight and premature  birth. What are the side effects of taking folic acid? Folic acid supplements may cause side effects, such as:  Diarrhea.  Abdominal cramping.  Folic acid can also interact with certain medicines that are used to treat other conditions. These medicines include:  Methotrexate. This is an anticancer drug that is also used to treat some autoimmune diseases.  Antiepileptic medicine, such as phenytoin, carbamazepine, and valproate.  Sulfasalazine. This medicine is used to treat ulcerative colitis.  How should I take folic acid during pregnancy? Even women who have a healthy, well-balanced diet may not get enough folate from food. Synthetic folic acid is easier for your body to use than the folate that is found naturally in certain foods. In addition to eating folate-rich foods, you can ensure that you get enough folic acid by taking prenatal vitamins and eating foods that are fortified with folic acid. Make sure your prenatal vitamin or B vitamin supplement contains 400-800 micrograms of folic acid. What foods should I eat? Folate is found naturally in:  Dark green leafy vegetables, such as spinach.  Asparagus.  Brussels sprouts.  Citrus fruits and juices.  Nuts.  Beans.  Peas.  Eggs.  Meat, poultry, and seafood.  Soy products.  Whole grains.  Folic acid is often added to certain foods, including:  Bread.  Pasta.  White rice.  Breakfast cereal.  Flour.  Cornmeal.  When should I seek medical care? Some women may need to take more than the recommended amount of folic acid. Talk with your health care provider about your folic acid needs if:  You had a baby with a neural tube defect and you want to get pregnant again.  You have a family history of spina bifida.  You have spina bifida and you want to get pregnant.  Talk with your doctor about folic acid supplements if you are taking medicine for any of the following conditions:  Epilepsy.  Type 2  diabetes.  Autoimmune diseases, including rheumatoid arthritis, lupus, psoriasis, celiac disease, and inflammatory bowel disease.  Asthma.  Kidney disease.  Liver disease.  Sickle cell disease.  This information is not intended to replace advice given to you by your health care provider. Make sure you discuss any questions you have with your health care provider. Document Released: 01/14/2003 Document Revised: 12/08/2015 Document Reviewed: 09/25/2014 Elsevier Interactive Patient Education  2017 [employer]

## 2017-05-06 LAB — IGP,RFX APTIMA HPV ALL PTH: PAP SMEAR COMMENT: 0

## 2017-05-12 NOTE — Telephone Encounter (Signed)
Late Entry. I have called patient on 05/06/2017 and 05/12/2017. Message left for patient to return my call.

## 2017-05-16 NOTE — Telephone Encounter (Signed)
Kate, I know this late, I have trying to call this patient with no response. Please let me know what you want me to do. 

## 2017-05-16 NOTE — Telephone Encounter (Signed)
I sent refills. Go ahead and send her a letter for her to contact our office. Thanks.

## 2017-06-22 ENCOUNTER — Ambulatory Visit (INDEPENDENT_AMBULATORY_CARE_PROVIDER_SITE_OTHER): Payer: 59

## 2017-06-22 ENCOUNTER — Ambulatory Visit (INDEPENDENT_AMBULATORY_CARE_PROVIDER_SITE_OTHER): Payer: 59 | Admitting: Maternal Newborn

## 2017-06-22 ENCOUNTER — Other Ambulatory Visit: Payer: Self-pay | Admitting: Maternal Newborn

## 2017-06-22 VITALS — BP 158/98 | HR 90 | Ht 65.0 in | Wt 158.0 lb

## 2017-06-22 DIAGNOSIS — Z369 Encounter for antenatal screening, unspecified: Secondary | ICD-10-CM

## 2017-06-22 DIAGNOSIS — N9489 Other specified conditions associated with female genital organs and menstrual cycle: Secondary | ICD-10-CM

## 2017-06-22 DIAGNOSIS — Z3201 Encounter for pregnancy test, result positive: Secondary | ICD-10-CM | POA: Diagnosis not present

## 2017-06-22 DIAGNOSIS — Z0189 Encounter for other specified special examinations: Secondary | ICD-10-CM

## 2017-06-23 LAB — BETA HCG QUANT (REF LAB): hCG Quant: 1805 m[IU]/mL

## 2017-06-24 ENCOUNTER — Other Ambulatory Visit: Payer: 59

## 2017-06-24 ENCOUNTER — Encounter: Payer: Self-pay | Admitting: Maternal Newborn

## 2017-06-24 DIAGNOSIS — Z3201 Encounter for pregnancy test, result positive: Secondary | ICD-10-CM

## 2017-06-24 DIAGNOSIS — N9489 Other specified conditions associated with female genital organs and menstrual cycle: Secondary | ICD-10-CM

## 2017-06-24 NOTE — Progress Notes (Signed)
Obstetrics & Gynecology Office Visit   Chief Complaint:  Chief Complaint  Patient presents with  . Dysmenorrhea    cramping so bad; breasts hurting; 3 pos home preg tests    History of Present Illness: Stacey Curtis has been trying to conceive and has had three positive urine pregnancy tests at home. LMP was 05/16/2017.  She presents today because she has had severe cramping and is concerned about whether she is pregnant, and if so,  whether her pregnancy is viable. She has not had any vaginal bleeding. She is taking prenatal vitamins.  Review of Systems: She has breast tenderness and is anxious. Review of systems otherwise negative unless noted in HPI.  Past Medical History:  Past Medical History:  Diagnosis Date  . Anxiety and depression   . Common migraine   . Nephrolithiasis   . 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 Recurrent cystitis     Past Surgical History:  Past Surgical History:  Procedure Laterality Date  . TONSILLECTOMY  2011    Gynecologic History: Patient's last menstrual period was 05/16/2017.  Obstetric History: G0P0000  Family History:  Family History  Problem Relation Age of Onset  . Cardiomyopathy Father   . Cardiomyopathy Paternal Grandfather   . Melanoma Paternal Grandmother   . Diabetes Neg Hx   . Hypertension Neg Hx   . Stroke Neg Hx   . Thyroid disease Neg Hx     Social History:  Social History   Socioeconomic History  . Marital status: Married    Spouse name: Not on file  . Number of children: 0  . Years of education: Not on file  . Highest education level: Not on file  Occupational History  . Occupation: Gaffer    Comment: [employer]  . Occupation: Passenger transport manager  Social Needs  . Financial resource strain: Not on file  . Food insecurity:    Worry: Not on file    Inability: Not on file  . Transportation needs:    Medical: Not on file    Non-medical: Not  on file  Tobacco Use  . Smoking status: Never Smoker  . Smokeless tobacco: Never Used  Substance and Sexual Activity  . Alcohol use: Yes    Comment: occasional  . Drug use: No  . Sexual activity: Yes    Partners: Male    Birth control/protection: None  Lifestyle  . Physical activity:    Days per week: 2 days    Minutes per session: 30 min  . Stress: Rather much  Relationships  . Social connections:    Talks on phone: More than three times a week    Gets together: Twice a week    Attends religious service: Never    Active member of club or organization: Yes    Attends meetings of clubs or organizations: More than 4 times per year    Relationship status: Married  . Intimate partner violence:    Fear of current or ex partner: No    Emotionally abused: No    Physically abused: No    Forced sexual activity: No  Other Topics Concern  . Not on file  Social History Narrative   Starting at [employer] for [employer] (fall 2015)                Allergies:  Allergies  Allergen Reactions  . Cephalexin Anaphylaxis  . Sulfa Antibiotics Anaphylaxis  Medications: Prior to Admission medications   Medication Sig Start Date End Date Taking? Authorizing Provider  Prenatal Vit-Fe Fumarate-FA (MULTIVITAMIN-PRENATAL) 27-0.8 MG TABS tablet Take 1 tablet by mouth daily at 12 noon.   Yes [provider]  nitrofurantoin (MACRODANTIN) 50 MG capsule Take 100 mg by mouth. 05/05/16   [provider]    Physical Exam Vitals:  Vitals:   06/22/17 1430  BP: (!) 158/98  Pulse: 90   Patient's last menstrual period was 05/16/2017.  General: anxious, not distressed HEENT: normocephalic, anicteric Pulmonary: No increased work of breathing Neurologic: Grossly intact Psychiatric: mood appropriate, affect full  Assessment: 27 y.o. G0P0000 presenting to confirm pregnancy and viability.  Plan: Problem List Items Addressed This Visit    None    Visit Diagnoses    Uterine  cramping    -  Primary   Relevant Orders   Beta HCG, Quant (Completed)   Beta HCG, Quant   Encounter for fetal ultrasound       Positive urine pregnancy test       Relevant Orders   Beta HCG, Quant (Completed)   Beta HCG, Quant   Encounter for imaging to assess myocardial viability       Relevant Orders   US OB Comp Less 14 Wks     Beta hCG ordered for today and in 48 hours. Ultrasound today shows intrauterine single gestational sac, too small to calculate gestational age, no yolk sac and no embryo.  She will schedule an NOB appointment and return for a follow-up ultrasound in 2 weeks. Will notify of beta hCG results by phone.  A total of 15 minutes were spent in face-to-face contact with the patient during this encounter with over half of that time devoted to counseling and coordination of care.  Marcelyn Bruins, CNM 06/24/2017  8:08 AM

## 2017-06-25 LAB — BETA HCG QUANT (REF LAB): hCG Quant: 3891 m[IU]/mL

## 2017-06-30 ENCOUNTER — Other Ambulatory Visit: Payer: Self-pay | Admitting: Primary Care

## 2017-06-30 DIAGNOSIS — F32A Depression, unspecified: Secondary | ICD-10-CM

## 2017-06-30 DIAGNOSIS — F419 Anxiety disorder, unspecified: Principal | ICD-10-CM

## 2017-06-30 DIAGNOSIS — F329 Major depressive disorder, single episode, unspecified: Secondary | ICD-10-CM

## 2017-07-07 ENCOUNTER — Ambulatory Visit (INDEPENDENT_AMBULATORY_CARE_PROVIDER_SITE_OTHER): Payer: 59 | Admitting: Certified Nurse Midwife

## 2017-07-07 ENCOUNTER — Other Ambulatory Visit: Payer: Self-pay | Admitting: Maternal Newborn

## 2017-07-07 ENCOUNTER — Encounter: Payer: Self-pay | Admitting: Certified Nurse Midwife

## 2017-07-07 ENCOUNTER — Ambulatory Visit (INDEPENDENT_AMBULATORY_CARE_PROVIDER_SITE_OTHER): Payer: 59

## 2017-07-07 VITALS — BP 102/62 | HR 87 | Ht 65.0 in | Wt 157.0 lb

## 2017-07-07 DIAGNOSIS — Z3400 Encounter for supervision of normal first pregnancy, unspecified trimester: Secondary | ICD-10-CM | POA: Insufficient documentation

## 2017-07-07 DIAGNOSIS — Z0189 Encounter for other specified special examinations: Secondary | ICD-10-CM | POA: Diagnosis not present

## 2017-07-07 DIAGNOSIS — Z3A01 Less than 8 weeks gestation of pregnancy: Secondary | ICD-10-CM

## 2017-07-07 DIAGNOSIS — N3090 Cystitis, unspecified without hematuria: Secondary | ICD-10-CM

## 2017-07-07 DIAGNOSIS — Z3491 Encounter for supervision of normal pregnancy, unspecified, first trimester: Secondary | ICD-10-CM | POA: Diagnosis not present

## 2017-07-07 LAB — OB RESULTS CONSOLE VARICELLA ZOSTER ANTIBODY, IGG: Varicella: IMMUNE

## 2017-07-07 NOTE — Progress Notes (Signed)
New Obstetric Patient H&P    Chief Complaint: "Desires prenatal care"   History of Present Illness: Stacey Curtis  is a 27 y.o. G1P0 White female, LMP 05/16/2017 presents with amenorrhea and positive home pregnancy test. Based on her  LMP, her EDC is: 02/20/18 and her EGA is [redacted]w[redacted]d. Cycles are 7. days, regular, and occur approximately every : 28 days. However on ultrasound today there is a SIUP with CRL 6wk4d changing her EDC to 02/26/2018. FCA was 120 BPM.  Her last pap smear was 2 years ago and was NIL.    She had a urine pregnancy test which was positive approximately 06/22/2017.Marland Kitchen Since her LMP she claims she has experienced breast tenderness, lower abdominal cramping, and nausea. She denies vaginal bleeding. She was seen 2 weeks ago at [facility] for the abdominal cramping and there was a small gestational sac in the uterus. No ovarian masses were seen. Her cramping has lessened significantly.  Her past medical history is remarkable for anxiety/ depression (took Zoloft in grad school, stopped prior to becoming pregnant), common migraine, frequent UTIS (takes Macrobid prophylaxis occasionally), and nephrolithiasis. .   Since her LMP, she admits to the use of tobacco products  No She did drink alcohol since her LMP, but not since her +UPT She denies use of illicit drugs She claims she has gained   17 pounds since April 2019 There are cats in the home in the home  no If yes NA She admits close contact with children on a regular basis  no  She has had chicken pox in the past no She has had Tuberculosis exposures, symptoms, or previously tested positive for TB   no Current or past history of domestic violence. no  Genetic Screening/Teratology Counseling: (Includes patient, baby's father, or anyone in either family with:)   1. Patient's age >/= 20 at [facility]  no 2. Thalassemia ([place], [place], Mediterranean, or Asian background): MCV<80  no 3. Neural tube defect (meningomyelocele, spina bifida,  anencephaly)  no 4. Congenital heart defect  no  5. Down syndrome  no 6. Tay-Sachs (Jewish, [place]  no 7. Canavan's Disease  no 8. Sickle cell disease or trait (African)  no  9. Hemophilia or other blood disorders  no  10. Muscular dystrophy  no  11. Cystic fibrosis  no  12. Huntington's Chorea  no  13. Mental retardation/autism  no 14. Other inherited genetic or chromosomal disorder  no 15. Maternal metabolic disorder (DM, PKU, etc)  no 16. Patient or FOB with a child with a birth defect not listed above no  16a. Patient or FOB with a birth defect themselves no 17. Recurrent pregnancy loss, or stillbirth  no  18. Any medications since LMP other than prenatal vitamins (include vitamins, supplements, OTC meds, drugs, alcohol)  no 19. Any other genetic/environmental exposure to discuss  no  Infection History:   1. Lives with someone with TB or TB exposed  no  2. Patient or partner has history of genital herpes  no 3. Rash or viral illness since LMP  no 4. History of STI (GC, CT, HPV, syphilis, HIV)  no 5. History of recent travel :  Yes, [place], [place]  Other pertinent information:  Yes, Bernette Mayers, FOB is 27 yo driver for [employer] and will be involved with care     Review of Systems:10 point review of systems negative unless otherwise noted in HPI  Past Medical History:  Past Medical History:  Diagnosis Date  .  Anxiety and depression   . Common migraine   . Nephrolithiasis   . PATELLO-FEMORAL SYNDROME 08/16/2008   Qualifier: Diagnosis of  By: Patsy Lageropland MD, Karleen HampshireSpencer    . Recurrent cystitis     Past Surgical History:  Past Surgical History:  Procedure Laterality Date  . TONSILLECTOMY  2011    Gynecologic History: Patient's last menstrual period was 05/16/2017.  Obstetric History: G1P0000  Family History:  Family History  Problem Relation Age of Onset  . Cardiomyopathy Father   . Cardiomyopathy Paternal Grandfather   . Melanoma Paternal Grandmother   .  Cerebral palsy Cousin   . Diabetes Neg Hx   . Hypertension Neg Hx   . Stroke Neg Hx   . Thyroid disease Neg Hx     Social History:  Social History   Socioeconomic History  . Marital status: Married    Spouse name: Not on file  . Number of children: 0  . Years of education: Not on file  . Highest education level: Not on file  Occupational History  . Occupation: GafferGraduate student    Comment: [employer]  . Occupation: Passenger transport managerHR/ talent acquisition  Social Needs  . Financial resource strain: Not on file  . Food insecurity:    Worry: Not on file    Inability: Not on file  . Transportation needs:    Medical: Not on file    Non-medical: Not on file  Tobacco Use  . Smoking status: Never Smoker  . Smokeless tobacco: Never Used  Substance and Sexual Activity  . Alcohol use: Yes    Comment: occasional  . Drug use: No  . Sexual activity: Yes    Partners: Male    Birth control/protection: None  Lifestyle  . Physical activity:    Days per week: 2 days    Minutes per session: 30 min  . Stress: Rather much  Relationships  . Social connections:    Talks on phone: More than three times a week    Gets together: Twice a week    Attends religious service: Never    Active member of club or organization: Yes    Attends meetings of clubs or organizations: More than 4 times per year    Relationship status: Married  . Intimate partner violence:    Fear of current or ex partner: No    Emotionally abused: No    Physically abused: No    Forced sexual activity: No  Other Topics Concern  . Not on file  Social History Narrative   Starting at [employer] for [employer] (fall 2015)                Allergies:  Allergies  Allergen Reactions  . Cephalexin Anaphylaxis  . Sulfa Antibiotics Anaphylaxis    Medications: Prior to Admission medications   Medication Sig Start Date End Date Taking? Authorizing Provider  Prenatal Vit-Fe Fumarate-FA (MULTIVITAMIN-PRENATAL) 27-0.8 MG TABS tablet  Take 1 tablet by mouth daily at 12 noon.   Yes [provider]  nitrofurantoin (MACRODANTIN) 50 MG capsule Take 100 mg by mouth. 05/05/16   [provider]    Physical Exam Vitals: BP 102/62   Pulse 87   Wt 157 lb (71.2 kg)   LMP 05/16/2017   BMI 26.13 kg/m  General: WF in NAD HEENT: normocephalic, anicteric Thyroid: no enlargement, no palpable nodules Pulmonary: No increased work of breathing, CTAB Breasts: tender, soft, no masses Cardiovascular: RRR, without murmur Abdomen:  Soft, mild tenderness lower abdomen, non-distended.  Umbilicus without lesions.  No hepatomegaly, or masses palpable. No evidence of hernia  Genitourinary:  External: Normal external female genitalia.  Normal urethral meatus, normal  Bartholin's and Skene's glands.    Vagina: Normal vaginal mucosa, no evidence of prolapse.  Bimanual deferred due to today's ultrasound    Extremities: no edema, erythema, or tenderness Neurologic: Grossly intact Psychiatric: mood appropriate, affect full   Assessment: 27 y.o. G1P0000 at 6wk4d presenting to initiate prenatal care Nausea of pregnancy Hx of recurrent cystitis Hx of anxiety/depression Plan: 1) Avoid alcoholic beverages. 2) Advised to hold Macrobid in the first trimester. Will screen frequently for UTIs  3) Discontinue the use of all non-medicinal drugs and chemicals.  4) Take prenatal vitamins daily. Can switch to Prenatal Gummies or Children's Chewable vitamins due to nausea. Given samples of Bonjesta with instuctions for use.  5) Nutrition, food safety (fish, cheese advisories, and high nitrite foods) and exercise discussed. 6) Hospital and practice style discussed with cross coverage system.  7) Genetic Screening, such as with 1st Trimester Screening, cell free fetal DNA, AFP testing, carrier screening, and Ultrasound,  is discussed with patient. At the conclusion of today's visit patient is undecided about genetic testing 8) Patient is asked  about travel to areas at risk for the Zika virus, and counseled to avoid travel and exposure to mosquitoes or sexual partners who may have themselves been exposed to the virus. Testing is discussed, and will be ordered as appropriate.  9) RTO in 4 weeks Farrel Conners, CNM

## 2017-07-08 LAB — RPR+RH+ABO+RUB AB+AB SCR+CB...
ANTIBODY SCREEN: NEGATIVE
HEMATOCRIT: 36.8 % (ref 34.0–46.6)
HEMOGLOBIN: 12.5 g/dL (ref 11.1–15.9)
HIV Screen 4th Generation wRfx: NONREACTIVE
Hepatitis B Surface Ag: NEGATIVE
MCH: 30 pg (ref 26.6–33.0)
MCHC: 34 g/dL (ref 31.5–35.7)
MCV: 89 fL (ref 79–97)
Platelets: 263 10*3/uL (ref 150–450)
RBC: 4.16 x10E6/uL (ref 3.77–5.28)
RDW: 12.8 % (ref 12.3–15.4)
RH TYPE: POSITIVE
RPR Ser Ql: NONREACTIVE
RUBELLA: 6.94 {index} (ref >0.99)
Varicella zoster IgG: 1713 index (ref >165)
WBC: 7.5 10*3/uL (ref 3.4–10.8)

## 2017-07-09 LAB — URINE CULTURE

## 2017-07-09 LAB — URINE DRUG PANEL 7
Amphetamines, Urine: NEGATIVE ng/mL
BARBITURATE QUANT UR: NEGATIVE ng/mL
Benzodiazepine Quant, Ur: NEGATIVE ng/mL
Cannabinoid Quant, Ur: NEGATIVE ng/mL
Cocaine (Metab.): NEGATIVE ng/mL
Opiate Quant, Ur: NEGATIVE ng/mL
PCP Quant, Ur: NEGATIVE ng/mL

## 2017-07-09 LAB — CHLAMYDIA/GONOCOCCUS/TRICHOMONAS, NAA
CHLAMYDIA BY NAA: NEGATIVE
Gonococcus by NAA: NEGATIVE
Trich vag by NAA: NEGATIVE

## 2017-08-05 ENCOUNTER — Ambulatory Visit (INDEPENDENT_AMBULATORY_CARE_PROVIDER_SITE_OTHER): Payer: 59 | Admitting: Certified Nurse Midwife

## 2017-08-05 VITALS — BP 90/50 | Wt 157.0 lb

## 2017-08-05 DIAGNOSIS — Z3A10 10 weeks gestation of pregnancy: Secondary | ICD-10-CM

## 2017-08-05 DIAGNOSIS — Z3491 Encounter for supervision of normal pregnancy, unspecified, first trimester: Secondary | ICD-10-CM

## 2017-08-05 DIAGNOSIS — Z1379 Encounter for other screening for genetic and chromosomal anomalies: Secondary | ICD-10-CM

## 2017-08-05 MED ORDER — DOXYLAMINE-PYRIDOXINE ER 20-20 MG PO TBCR: 1.0000 | EXTENDED_RELEASE_TABLET | Freq: Every day | ORAL | 1 refills | Status: DC

## 2017-08-05 NOTE — Progress Notes (Signed)
C/o headaches.rj

## 2017-08-06 NOTE — Progress Notes (Signed)
ROB at 10wk5d: Bonjesta has decreased her nausea, needs refill. Desires cell free DNA testing FHTs 176 with DT today RX for Bonjesta sent to [facility] and samples given Materniti 21 drawn today ROB in 4 weeks Farrel Connersolleen Dorthy Magnussen, CNM

## 2017-08-11 LAB — MATERNIT 21 PLUS CORE, BLOOD
CHROMOSOME 13: NEGATIVE
CHROMOSOME 21: NEGATIVE
Chromosome 18: NEGATIVE
Y Chromosome: DETECTED

## 2017-08-12 ENCOUNTER — Telehealth: Payer: Self-pay | Admitting: Certified Nurse Midwife

## 2017-08-12 NOTE — Telephone Encounter (Signed)
Patient called with NOB lab results as well as Materniti 21 results. All normal. Materniti 21: diploid XY  Farrel Connersolleen Jeiden Daughtridge, [place]

## 2017-08-12 NOTE — Telephone Encounter (Signed)
Patient is calling for labs results. Please advise. 

## 2017-09-02 ENCOUNTER — Ambulatory Visit (INDEPENDENT_AMBULATORY_CARE_PROVIDER_SITE_OTHER): Payer: 59 | Admitting: Certified Nurse Midwife

## 2017-09-02 VITALS — BP 90/50 | Wt 158.0 lb

## 2017-09-02 DIAGNOSIS — O9989 Other specified diseases and conditions complicating pregnancy, childbirth and the puerperium: Secondary | ICD-10-CM

## 2017-09-02 DIAGNOSIS — Z3A14 14 weeks gestation of pregnancy: Secondary | ICD-10-CM

## 2017-09-02 DIAGNOSIS — N390 Urinary tract infection, site not specified: Secondary | ICD-10-CM

## 2017-09-02 DIAGNOSIS — Z3491 Encounter for supervision of normal pregnancy, unspecified, first trimester: Secondary | ICD-10-CM

## 2017-09-02 DIAGNOSIS — Z363 Encounter for antenatal screening for malformations: Secondary | ICD-10-CM

## 2017-09-02 LAB — POCT URINALYSIS DIPSTICK OB
Glucose, UA: NEGATIVE — AB
PROTEIN: NEGATIVE

## 2017-09-02 NOTE — Progress Notes (Signed)
ROB at 14wk5d: Nausea and constipation a little better. Nature Made vitamins causing nausea: will try [facility] Having some BLAP with coughing, sneezing, turning-round ligament Materniti 21 results: diploid XY.  Desires MSAFP-will return in 2 weeks for MSAFP RTO in 4 weeks for anatomy scan and ROB Urine culture sent Farrel Connersolleen Berneta Sconyers, CNM

## 2017-09-04 LAB — URINE CULTURE

## 2017-09-16 ENCOUNTER — Encounter: Payer: Self-pay | Admitting: Advanced Practice Midwife

## 2017-09-16 ENCOUNTER — Ambulatory Visit (INDEPENDENT_AMBULATORY_CARE_PROVIDER_SITE_OTHER): Payer: 59 | Admitting: Advanced Practice Midwife

## 2017-09-16 VITALS — BP 90/60 | Wt 159.0 lb

## 2017-09-16 DIAGNOSIS — Z3A16 16 weeks gestation of pregnancy: Secondary | ICD-10-CM

## 2017-09-16 DIAGNOSIS — N3090 Cystitis, unspecified without hematuria: Secondary | ICD-10-CM | POA: Diagnosis not present

## 2017-09-16 DIAGNOSIS — O9989 Other specified diseases and conditions complicating pregnancy, childbirth and the puerperium: Secondary | ICD-10-CM

## 2017-09-16 DIAGNOSIS — Z3400 Encounter for supervision of normal first pregnancy, unspecified trimester: Secondary | ICD-10-CM

## 2017-09-16 LAB — POCT URINALYSIS DIPSTICK OB
Glucose, UA: NEGATIVE — AB
PROTEIN: NEGATIVE

## 2017-09-16 NOTE — Progress Notes (Signed)
ROB- no concerns This past sat had a hemorrhoid rupture. Last time she saw blood was Sunday. Good now.

## 2017-09-16 NOTE — Patient Instructions (Signed)
Hemorrhoids  Hemorrhoids are swollen veins in and around the rectum or anus. There are two types of hemorrhoids:   Internal hemorrhoids. These occur in the veins that are just inside the rectum. They may poke through to the outside and become irritated and painful.   External hemorrhoids. These occur in the veins that are outside of the anus and can be felt as a painful swelling or hard lump near the anus.    Most hemorrhoids do not cause serious problems, and they can be managed with home treatments such as diet and lifestyle changes. If home treatments do not help your symptoms, procedures can be done to shrink or remove the hemorrhoids.  What are the causes?  This condition is caused by increased pressure in the anal area. This pressure may result from various things, including:   Constipation.   Straining to have a bowel movement.   Diarrhea.   Pregnancy.   Obesity.   Sitting for long periods of time.   Heavy lifting or other activity that causes you to strain.   Anal sex.    What are the signs or symptoms?  Symptoms of this condition include:   Pain.   Anal itching or irritation.   Rectal bleeding.   Leakage of stool (feces).   Anal swelling.   One or more lumps around the anus.    How is this diagnosed?  This condition can often be diagnosed through a visual exam. Other exams or tests may also be done, such as:   Examination of the rectal area with a gloved hand (digital rectal exam).   Examination of the anal canal using a small tube (anoscope).   A blood test, if you have lost a significant amount of blood.   A test to look inside the colon (sigmoidoscopy or colonoscopy).    How is this treated?  This condition can usually be treated at home. However, various procedures may be done if dietary changes, lifestyle changes, and other home treatments do not help your symptoms. These procedures can help make the hemorrhoids smaller or remove them completely. Some of these procedures involve  surgery, and others do not. Common procedures include:   Rubber band ligation. Rubber bands are placed at the base of the hemorrhoids to cut off the blood supply to them.   Sclerotherapy. Medicine is injected into the hemorrhoids to shrink them.   Infrared coagulation. A type of light energy is used to get rid of the hemorrhoids.   Hemorrhoidectomy surgery. The hemorrhoids are surgically removed, and the veins that supply them are tied off.   Stapled hemorrhoidopexy surgery. A circular stapling device is used to remove the hemorrhoids and use staples to cut off the blood supply to them.    Follow these instructions at home:  Eating and drinking   Eat foods that have a lot of fiber in them, such as whole grains, beans, nuts, fruits, and vegetables. Ask your health care provider about taking products that have added fiber (fiber supplements).   Drink enough fluid to keep your urine clear or pale yellow.  Managing pain and swelling   Take warm sitz baths for 20 minutes, 3-4 times a day to ease pain and discomfort.   If directed, apply ice to the affected area. Using ice packs between sitz baths may be helpful.  ? Put ice in a plastic bag.  ? Place a towel between your skin and the bag.  ? Leave the ice on for 20   minutes, 2-3 times a day.  General instructions   Take over-the-counter and prescription medicines only as told by your health care provider.   Use medicated creams or suppositories as told.   Exercise regularly.   Go to the bathroom when you have the urge to have a bowel movement. Do not wait.   Avoid straining to have bowel movements.   Keep the anal area dry and clean. Use wet toilet paper or moist towelettes after a bowel movement.   Do not sit on the toilet for long periods of time. This increases blood pooling and pain.  Contact a health care provider if:   You have increasing pain and swelling that are not controlled by treatment or medicine.   You have uncontrolled bleeding.   You  have difficulty having a bowel movement, or you are unable to have a bowel movement.   You have pain or inflammation outside the area of the hemorrhoids.  This information is not intended to replace advice given to you by your health care provider. Make sure you discuss any questions you have with your health care provider.  Document Released: 01/09/2000 Document Revised: 06/11/2015 Document Reviewed: 09/25/2014  Elsevier Interactive Patient Education  2018 Elsevier Inc.  Constipation, Adult  Constipation is when a person has fewer bowel movements in a week than normal, has difficulty having a bowel movement, or has stools that are dry, hard, or larger than normal. Constipation may be caused by an underlying condition. It may become worse with age if a person takes certain medicines and does not take in enough fluids.  Follow these instructions at home:  Eating and drinking     Eat foods that have a lot of fiber, such as fresh fruits and vegetables, whole grains, and beans.   Limit foods that are high in fat, low in fiber, or overly processed, such as french fries, hamburgers, cookies, candies, and soda.   Drink enough fluid to keep your urine clear or pale yellow.  General instructions   Exercise regularly or as told by your health care provider.   Go to the restroom when you have the urge to go. Do not hold it in.   Take over-the-counter and prescription medicines only as told by your health care provider. These include any fiber supplements.   Practice pelvic floor retraining exercises, such as deep breathing while relaxing the lower abdomen and pelvic floor relaxation during bowel movements.   Watch your condition for any changes.   Keep all follow-up visits as told by your health care provider. This is important.  Contact a health care provider if:   You have pain that gets worse.   You have a fever.   You do not have a bowel movement after 4 days.   You vomit.   You are not hungry.   You lose  weight.   You are bleeding from the anus.   You have thin, pencil-like stools.  Get help right away if:   You have a fever and your symptoms suddenly get worse.   You leak stool or have blood in your stool.   Your abdomen is bloated.   You have severe pain in your abdomen.   You feel dizzy or you faint.  This information is not intended to replace advice given to you by your health care provider. Make sure you discuss any questions you have with your health care provider.  Document Released: 10/10/2003 Document Revised: 08/01/2015 Document Reviewed: 07/02/2015    Elsevier Interactive Patient Education  2018 Elsevier Inc.

## 2017-09-16 NOTE — Progress Notes (Signed)
  Routine Prenatal Care Visit  Subjective  Stacey PouchCatherine L Prochazka is a 27 y.o. G1P0000 at 8038w5d being seen today for ongoing prenatal care.  She is currently monitored for the following issues for this low-risk pregnancy and has COMMON MIGRAINE; FIBROCYSTIC BREAST DISEASE, MINIMAL; Disturbed concentration; Anxiety and depression; Supervision of low-risk first pregnancy; and Recurrent cystitis on their problem list.  ----------------------------------------------------------------------------------- Patient reports increasing dietary fiber and adding probiotics. Constipation has improved.    . Vag. Bleeding: None.  Movement: Absent. Denies leaking of fluid.  ----------------------------------------------------------------------------------- The following portions of the patient's history were reviewed and updated as appropriate: allergies, current medications, past family history, past medical history, past social history, past surgical history and problem list. Problem list updated.   Objective  Blood pressure 90/60, weight 159 lb (72.1 kg), last menstrual period 05/16/2017. Pregravid weight 157 lb (71.2 kg) Total Weight Gain 2 lb (0.907 kg) Urinalysis:      Fetal Status: Fetal Heart Rate (bpm): 150   Movement: Absent     General:  Alert, oriented and cooperative. Patient is in no acute distress.  Skin: Skin is warm and dry. No rash noted.   Cardiovascular: Normal heart rate noted  Respiratory: Normal respiratory effort, no problems with respiration noted  Abdomen: Soft, gravid, appropriate for gestational age.       Pelvic:  Cervical exam deferred        Extremities: Normal range of motion.  Edema: None  Mental Status: Normal mood and affect. Normal behavior. Normal judgment and thought content.   Assessment   27 y.o. G1P0000 at 1338w5d by  02/26/2018, by Ultrasound presenting for routine prenatal visit  Plan   First Pregnancy Problems (from 07/07/17 to present)    No problems  associated with this episode.       Preterm labor symptoms and general obstetric precautions including but not limited to vaginal bleeding, contractions, leaking of fluid and fetal movement were reviewed in detail with the patient. Please refer to After Visit Summary for other counseling recommendations.   Return in about 4 weeks (around 10/14/2017) for anatomy scan and rob.  Tresea MallJane Sharunda Salmon, CNM 09/16/2017 3:23 PM

## 2017-09-18 LAB — URINE CULTURE

## 2017-09-20 LAB — AFP, SERUM, OPEN SPINA BIFIDA
AFP MoM: 1.54
AFP Value: 48 ng/mL
GEST. AGE ON COLLECTION DATE: 16 wk
Maternal Age At EDD: 27.2 yr
OSBR RISK 1 IN: 2457
Test Results:: NEGATIVE
Weight: 159 [lb_av]

## 2017-09-30 ENCOUNTER — Ambulatory Visit (INDEPENDENT_AMBULATORY_CARE_PROVIDER_SITE_OTHER): Payer: 59

## 2017-09-30 ENCOUNTER — Encounter: Payer: Self-pay | Admitting: Obstetrics and Gynecology

## 2017-09-30 ENCOUNTER — Ambulatory Visit (INDEPENDENT_AMBULATORY_CARE_PROVIDER_SITE_OTHER): Payer: 59 | Admitting: Obstetrics and Gynecology

## 2017-09-30 VITALS — BP 110/60 | Wt 161.0 lb

## 2017-09-30 DIAGNOSIS — Z3402 Encounter for supervision of normal first pregnancy, second trimester: Secondary | ICD-10-CM

## 2017-09-30 DIAGNOSIS — Z363 Encounter for antenatal screening for malformations: Secondary | ICD-10-CM

## 2017-09-30 DIAGNOSIS — Z23 Encounter for immunization: Secondary | ICD-10-CM | POA: Diagnosis not present

## 2017-09-30 DIAGNOSIS — Z3A18 18 weeks gestation of pregnancy: Secondary | ICD-10-CM

## 2017-09-30 DIAGNOSIS — Z3491 Encounter for supervision of normal pregnancy, unspecified, first trimester: Secondary | ICD-10-CM

## 2017-09-30 NOTE — Progress Notes (Signed)
    Routine Prenatal Care Visit  Subjective  Stacey Curtis is a 27 y.o. G1P0000 at [redacted]w[redacted]d being seen today for ongoing prenatal care.  She is currently monitored for the following issues for this low-risk pregnancy and has COMMON MIGRAINE; FIBROCYSTIC BREAST DISEASE, MINIMAL; Disturbed concentration; Anxiety and depression; Supervision of low-risk first pregnancy; and Recurrent cystitis on their problem list.  ----------------------------------------------------------------------------------- Patient reports no complaints.   Contractions: Not present. Vag. Bleeding: None.  Movement: Present. Denies leaking of fluid.  ----------------------------------------------------------------------------------- The following portions of the patient's history were reviewed and updated as appropriate: allergies, current medications, past family history, past medical history, past social history, past surgical history and problem list. Problem list updated.   Objective  Blood pressure 110/60, weight 161 lb (73 kg), last menstrual period 05/16/2017. Pregravid weight 157 lb (71.2 kg) Total Weight Gain 4 lb (1.814 kg) Urinalysis:      Fetal Status: Fetal Heart Rate (bpm): 155   Movement: Present     General:  Alert, oriented and cooperative. Patient is in no acute distress.  Skin: Skin is warm and dry. No rash noted.   Cardiovascular: Normal heart rate noted  Respiratory: Normal respiratory effort, no problems with respiration noted  Abdomen: Soft, gravid, appropriate for gestational age. Pain/Pressure: Absent     Pelvic:  Cervical exam deferred        Extremities: Normal range of motion.  Edema: None  ental Status: Normal mood and affect. Normal behavior. Normal judgment and thought content.     Assessment   27 y.o. G1P0000 at [redacted]w[redacted]d by  02/26/2018, by Ultrasound presenting for routine prenatal visit  Plan   First Pregnancy Problems (from 07/07/17 to present)    No problems associated with  this episode.       Gestational age appropriate obstetric precautions including but not limited to vaginal bleeding, contractions, leaking of fluid and fetal movement were reviewed in detail with the patient.    Discussed "internal hemorrhoids." She reports that her bleeding has gotten better with adding fiber to her diet. However, she has not had a rectal exam to confirm this diagnosis. Patient declined today. Will have exam if her issue reoccurs. Discussed that the bleeding could be related to another pathology and should be investigated if it continues.   Given information on prenatal classes with [facility].  Given information on birthcontrol options postpartum. Recommended [email or web address]. She is planning on using condoms. Given information on volunteer doula program at the hospital.  Discussed breast feeding. Patient is planning on breast feeding. Lactation consultation was advised.   Return in about 4 weeks (around 10/28/2017) for ROB.  Adelene Idler MD [facility], [facility] 09/30/17 3:30 PM

## 2017-09-30 NOTE — Progress Notes (Signed)
ROB Anatomy u/s  No concerns  Flu shot today

## 2017-10-28 ENCOUNTER — Ambulatory Visit (INDEPENDENT_AMBULATORY_CARE_PROVIDER_SITE_OTHER): Payer: 59 | Admitting: Certified Nurse Midwife

## 2017-10-28 VITALS — BP 100/60 | Wt 172.5 lb

## 2017-10-28 DIAGNOSIS — N3090 Cystitis, unspecified without hematuria: Secondary | ICD-10-CM

## 2017-10-28 DIAGNOSIS — O9989 Other specified diseases and conditions complicating pregnancy, childbirth and the puerperium: Secondary | ICD-10-CM

## 2017-10-28 DIAGNOSIS — Z3402 Encounter for supervision of normal first pregnancy, second trimester: Secondary | ICD-10-CM

## 2017-10-28 DIAGNOSIS — Z3A22 22 weeks gestation of pregnancy: Secondary | ICD-10-CM

## 2017-10-28 LAB — POCT URINALYSIS DIPSTICK OB
GLUCOSE, UA: NEGATIVE
POC,PROTEIN,UA: NEGATIVE

## 2017-10-28 NOTE — Progress Notes (Signed)
ROB at 22wk5d: Doing well. Gained 11# since last visit, but TWG was 15#. Feeling more fetal movement. Some left round ligament pains  Materniti 21 and MSAFP both negative.  ROB in 4 weeks

## 2017-10-28 NOTE — Progress Notes (Signed)
No concerns.rj  CCU collected for Oct. cx.rj

## 2017-10-30 LAB — URINE CULTURE

## 2017-11-24 ENCOUNTER — Ambulatory Visit (INDEPENDENT_AMBULATORY_CARE_PROVIDER_SITE_OTHER): Payer: 59 | Admitting: Obstetrics & Gynecology

## 2017-11-24 VITALS — BP 110/60 | Wt 182.0 lb

## 2017-11-24 DIAGNOSIS — Z3A26 26 weeks gestation of pregnancy: Secondary | ICD-10-CM

## 2017-11-24 DIAGNOSIS — Z131 Encounter for screening for diabetes mellitus: Secondary | ICD-10-CM

## 2017-11-24 DIAGNOSIS — Z3402 Encounter for supervision of normal first pregnancy, second trimester: Secondary | ICD-10-CM

## 2017-11-24 NOTE — Progress Notes (Signed)
  Subjective  Fetal Movement? yes Contractions? no Leaking Fluid? no Vaginal Bleeding? no  Objective  BP 110/60   Wt 182 lb (82.6 kg)   LMP 05/16/2017   BMI 30.29 kg/m  General: NAD Pumonary: no increased work of breathing Abdomen: gravid, non-tender Extremities: no edema Psychiatric: mood appropriate, affect full  Assessment  27 y.o. G1P0000 at [redacted]w[redacted]d by  02/26/2018, by Ultrasound presenting for routine prenatal visit  Plan   Problem List Items Addressed This Visit      Other   Supervision of low-risk first pregnancy    Other Visit Diagnoses    [redacted] weeks gestation of pregnancy    -  Primary   Encounter for screening examination for impaired glucose regulation and diabetes mellitus       Relevant Orders   28 Week RH+Panel    PNV, FMC, PTL precautions discussed  Annamarie Major, MD, [facility], [facility] 11/24/2017  3:14 PM

## 2017-11-24 NOTE — Patient Instructions (Signed)
LABS next visit!  Second Trimester of Pregnancy The second trimester is from week 14 through week 27 (months 4 through 6). The second trimester is often a time when you feel your best. Your body has adjusted to being pregnant, and you begin to feel better physically. Usually, morning sickness has lessened or quit completely, you may have more energy, and you may have an increase in appetite. The second trimester is also a time when the fetus is growing rapidly. At the end of the sixth month, the fetus is about 9 inches long and weighs about 1 pounds. You will likely begin to feel the baby move (quickening) between 16 and 20 weeks of pregnancy. Body changes during your second trimester Your body continues to go through many changes during your second trimester. The changes vary from woman to woman.  Your weight will continue to increase. You will notice your lower abdomen bulging out.  You may begin to get stretch marks on your hips, abdomen, and breasts.  You may develop headaches that can be relieved by medicines. The medicines should be approved by your health care provider.  You may urinate more often because the fetus is pressing on your bladder.  You may develop or continue to have heartburn as a result of your pregnancy.  You may develop constipation because certain hormones are causing the muscles that push waste through your intestines to slow down.  You may develop hemorrhoids or swollen, bulging veins (varicose veins).  You may have back pain. This is caused by: ? Weight gain. ? Pregnancy hormones that are relaxing the joints in your pelvis. ? A shift in weight and the muscles that support your balance.  Your breasts will continue to grow and they will continue to become tender.  Your gums may bleed and may be sensitive to brushing and flossing.  Dark spots or blotches (chloasma, mask of pregnancy) may develop on your face. This will likely fade after the baby is born.  A  dark line from your belly button to the pubic area (linea nigra) may appear. This will likely fade after the baby is born.  You may have changes in your hair. These can include thickening of your hair, rapid growth, and changes in texture. Some women also have hair loss during or after pregnancy, or hair that feels dry or thin. Your hair will most likely return to normal after your baby is born.  What to expect at prenatal visits During a routine prenatal visit:  You will be weighed to make sure you and the fetus are growing normally.  Your blood pressure will be taken.  Your abdomen will be measured to track your baby's growth.  The fetal heartbeat will be listened to.  Any test results from the previous visit will be discussed.  Your health care provider may ask you:  How you are feeling.  If you are feeling the baby move.  If you have had any abnormal symptoms, such as leaking fluid, bleeding, severe headaches, or abdominal cramping.  If you are using any tobacco products, including cigarettes, chewing tobacco, and electronic cigarettes.  If you have any questions.  Other tests that may be performed during your second trimester include:  Blood tests that check for: ? Low iron levels (anemia). ? High blood sugar that affects pregnant women (gestational diabetes) between 86 and 28 weeks. ? Rh antibodies. This is to check for a protein on red blood cells (Rh factor).  Urine tests to check  for infections, diabetes, or protein in the urine.  An ultrasound to confirm the proper growth and development of the baby.  An amniocentesis to check for possible genetic problems.  Fetal screens for spina bifida and Down syndrome.  HIV (human immunodeficiency virus) testing. Routine prenatal testing includes screening for HIV, unless you choose not to have this test.  Follow these instructions at home: Medicines  Follow your health care provider's instructions regarding medicine  use. Specific medicines may be either safe or unsafe to take during pregnancy.  Take a prenatal vitamin that contains at least 600 micrograms (mcg) of folic acid.  If you develop constipation, try taking a stool softener if your health care provider approves. Eating and drinking  Eat a balanced diet that includes fresh fruits and vegetables, whole grains, good sources of protein such as meat, eggs, or tofu, and low-fat dairy. Your health care provider will help you determine the amount of weight gain that is right for you.  Avoid raw meat and uncooked cheese. These carry germs that can cause birth defects in the baby.  If you have low calcium intake from food, talk to your health care provider about whether you should take a daily calcium supplement.  Limit foods that are high in fat and processed sugars, such as fried and sweet foods.  To prevent constipation: ? Drink enough fluid to keep your urine clear or pale yellow. ? Eat foods that are high in fiber, such as fresh fruits and vegetables, whole grains, and beans. Activity  Exercise only as directed by your health care provider. Most women can continue their usual exercise routine during pregnancy. Try to exercise for 30 minutes at least 5 days a week. Stop exercising if you experience uterine contractions.  Avoid heavy lifting, wear low heel shoes, and practice good posture.  A sexual relationship may be continued unless your health care provider directs you otherwise. Relieving pain and discomfort  Wear a good support bra to prevent discomfort from breast tenderness.  Take warm sitz baths to soothe any pain or discomfort caused by hemorrhoids. Use hemorrhoid cream if your health care provider approves.  Rest with your legs elevated if you have leg cramps or low back pain.  If you develop varicose veins, wear support hose. Elevate your feet for 15 minutes, 3-4 times a day. Limit salt in your diet. Prenatal Care  Write down  your questions. Take them to your prenatal visits.  Keep all your prenatal visits as told by your health care provider. This is important. Safety  Wear your seat belt at all times when driving.  Make a list of emergency phone numbers, including numbers for family, friends, the hospital, and police and fire departments. General instructions  Ask your health care provider for a referral to a local prenatal education class. Begin classes no later than the beginning of month 6 of your pregnancy.  Ask for help if you have counseling or nutritional needs during pregnancy. Your health care provider can offer advice or refer you to specialists for help with various needs.  Do not use hot tubs, steam rooms, or saunas.  Do not douche or use tampons or scented sanitary pads.  Do not cross your legs for long periods of time.  Avoid cat litter boxes and soil used by cats. These carry germs that can cause birth defects in the baby and possibly loss of the fetus by miscarriage or stillbirth.  Avoid all smoking, herbs, alcohol, and unprescribed drugs. Chemicals  in these products can affect the formation and growth of the baby.  Do not use any products that contain nicotine or tobacco, such as cigarettes and e-cigarettes. If you need help quitting, ask your health care provider.  Visit your dentist if you have not gone yet during your pregnancy. Use a soft toothbrush to brush your teeth and be gentle when you floss. Contact a health care provider if:  You have dizziness.  You have mild pelvic cramps, pelvic pressure, or nagging pain in the abdominal area.  You have persistent nausea, vomiting, or diarrhea.  You have a bad smelling vaginal discharge.  You have pain when you urinate. Get help right away if:  You have a fever.  You are leaking fluid from your vagina.  You have spotting or bleeding from your vagina.  You have severe abdominal cramping or pain.  You have rapid weight gain  or weight loss.  You have shortness of breath with chest pain.  You notice sudden or extreme swelling of your face, hands, ankles, feet, or legs.  You have not felt your baby move in over an hour.  You have severe headaches that do not go away when you take medicine.  You have vision changes. Summary  The second trimester is from week 14 through week 27 (months 4 through 6). It is also a time when the fetus is growing rapidly.  Your body goes through many changes during pregnancy. The changes vary from woman to woman.  Avoid all smoking, herbs, alcohol, and unprescribed drugs. These chemicals affect the formation and growth your baby.  Do not use any tobacco products, such as cigarettes, chewing tobacco, and e-cigarettes. If you need help quitting, ask your health care provider.  Contact your health care provider if you have any questions. Keep all prenatal visits as told by your health care provider. This is important. This information is not intended to replace advice given to you by your health care provider. Make sure you discuss any questions you have with your health care provider. Document Released: 01/05/2001 Document Revised: 02/17/2016 Document Reviewed: 02/17/2016 Elsevier Interactive Patient Education  [employer].

## 2017-11-25 ENCOUNTER — Encounter: Payer: 59 | Admitting: Obstetrics and Gynecology

## 2017-12-06 ENCOUNTER — Other Ambulatory Visit: Payer: 59

## 2017-12-06 ENCOUNTER — Encounter: Payer: Self-pay | Admitting: Obstetrics and Gynecology

## 2017-12-06 ENCOUNTER — Ambulatory Visit (INDEPENDENT_AMBULATORY_CARE_PROVIDER_SITE_OTHER): Payer: 59 | Admitting: Obstetrics and Gynecology

## 2017-12-06 VITALS — BP 118/74 | Wt 184.0 lb

## 2017-12-06 DIAGNOSIS — F32A Depression, unspecified: Secondary | ICD-10-CM

## 2017-12-06 DIAGNOSIS — Z3403 Encounter for supervision of normal first pregnancy, third trimester: Secondary | ICD-10-CM

## 2017-12-06 DIAGNOSIS — F329 Major depressive disorder, single episode, unspecified: Secondary | ICD-10-CM

## 2017-12-06 DIAGNOSIS — F418 Other specified anxiety disorders: Secondary | ICD-10-CM

## 2017-12-06 DIAGNOSIS — Z131 Encounter for screening for diabetes mellitus: Secondary | ICD-10-CM

## 2017-12-06 DIAGNOSIS — F419 Anxiety disorder, unspecified: Secondary | ICD-10-CM

## 2017-12-06 DIAGNOSIS — Z3A28 28 weeks gestation of pregnancy: Secondary | ICD-10-CM

## 2017-12-06 DIAGNOSIS — O99343 Other mental disorders complicating pregnancy, third trimester: Secondary | ICD-10-CM

## 2017-12-06 NOTE — Progress Notes (Signed)
  Routine Prenatal Care Visit  Subjective  Stacey Curtis is a 27 y.o. G1P0000 at 4991w2d being seen today for ongoing prenatal care.  She is currently monitored for the following issues for this low-risk pregnancy and has COMMON MIGRAINE; FIBROCYSTIC BREAST DISEASE, MINIMAL; Disturbed concentration; Anxiety and depression; Supervision of low-risk first pregnancy; and Recurrent cystitis on their problem list.  ----------------------------------------------------------------------------------- Patient reports no complaints.   Contractions: Not present. Vag. Bleeding: None.  Movement: Present. Denies leaking of fluid.  ----------------------------------------------------------------------------------- The following portions of the patient's history were reviewed and updated as appropriate: allergies, current medications, past family history, past medical history, past social history, past surgical history and problem list. Problem list updated.   Objective  Blood pressure 118/74, weight 184 lb (83.5 kg), last menstrual period 05/16/2017. Pregravid weight 157 lb (71.2 kg) Total Weight Gain 27 lb (12.2 kg) Urinalysis: Urine Protein    Urine Glucose    Fetal Status: Fetal Heart Rate (bpm): 130 Fundal Height: 28 cm Movement: Present     General:  Alert, oriented and cooperative. Patient is in no acute distress.  Skin: Skin is warm and dry. No rash noted.   Cardiovascular: Normal heart rate noted  Respiratory: Normal respiratory effort, no problems with respiration noted  Abdomen: Soft, gravid, appropriate for gestational age. Pain/Pressure: Absent     Pelvic:  Cervical exam deferred        Extremities: Normal range of motion.  Edema: None  Mental Status: Normal mood and affect. Normal behavior. Normal judgment and thought content.   Assessment   27 y.o. G1P0000 at 5191w2d by  02/26/2018, by Ultrasound presenting for routine prenatal visit  Plan   First Pregnancy Problems (from 07/07/17  to present)    No problems associated with this episode.       Preterm labor symptoms and general obstetric precautions including but not limited to vaginal bleeding, contractions, leaking of fluid and fetal movement were reviewed in detail with the patient. Please refer to After Visit Summary for other counseling recommendations.   - 28 week labs today - urine culture today, if patient able to void.   Return in about 2 weeks (around 12/20/2017) for Routine Prenatal Appointment.  Thomasene MohairStephen Shantrice Rodenberg, MD, [facility], [facility] 12/06/2017 8:51 AM

## 2017-12-07 ENCOUNTER — Telehealth: Payer: Self-pay | Admitting: Obstetrics & Gynecology

## 2017-12-07 ENCOUNTER — Telehealth: Payer: Self-pay

## 2017-12-07 LAB — 28 WEEK RH+PANEL
BASOS: 0 %
Basophils Absolute: 0 10*3/uL (ref 0.0–0.2)
EOS (ABSOLUTE): 0.1 10*3/uL (ref 0.0–0.4)
EOS: 1 %
GESTATIONAL DIABETES SCREEN: 163 mg/dL — AB (ref 65–139)
HEMATOCRIT: 33.6 % — AB (ref 34.0–46.6)
HEMOGLOBIN: 11.2 g/dL (ref 11.1–15.9)
HIV Screen 4th Generation wRfx: NONREACTIVE
Immature Grans (Abs): 0.3 10*3/uL — ABNORMAL HIGH (ref 0.0–0.1)
Immature Granulocytes: 3 %
LYMPHS ABS: 1.4 10*3/uL (ref 0.7–3.1)
Lymphs: 16 %
MCH: 29.9 pg (ref 26.6–33.0)
MCHC: 33.3 g/dL (ref 31.5–35.7)
MCV: 90 fL (ref 79–97)
MONOS ABS: 0.7 10*3/uL (ref 0.1–0.9)
Monocytes: 8 %
Neutrophils Absolute: 6.1 10*3/uL (ref 1.4–7.0)
Neutrophils: 72 %
Platelets: 241 10*3/uL (ref 150–450)
RBC: 3.74 x10E6/uL — AB (ref 3.77–5.28)
RDW: 12.3 % (ref 12.3–15.4)
RPR: NONREACTIVE
WBC: 8.6 10*3/uL (ref 3.4–10.8)

## 2017-12-07 NOTE — Telephone Encounter (Signed)
Patient is schedule 12/14/17

## 2017-12-07 NOTE — Telephone Encounter (Signed)
Pt called triage line, she is upset about failing the glucose test, I advised her that her sugar was elevated and she will need to take a 3hr, she wants a DR to call to explain better, she's upset wants to know is her baby ok? She will feel better with a provider to explain things. Please advise

## 2017-12-07 NOTE — Telephone Encounter (Signed)
Discussed test results, 3 hour test, and dietary modifications with her.

## 2017-12-07 NOTE — Telephone Encounter (Signed)
-----   Message from Nadara Mustardobert P Harris, MD sent at 12/07/2017 11:31 AM EST ----- Schedule 3 hour GTT due to slightly abnormal glucola test results of 163.

## 2017-12-07 NOTE — Progress Notes (Signed)
Schedule 3 hour GTT due to slightly abnormal glucola test results of 163.

## 2017-12-14 ENCOUNTER — Other Ambulatory Visit: Payer: Self-pay | Admitting: Obstetrics and Gynecology

## 2017-12-14 ENCOUNTER — Other Ambulatory Visit: Payer: 59

## 2017-12-14 DIAGNOSIS — O99810 Abnormal glucose complicating pregnancy: Secondary | ICD-10-CM

## 2017-12-15 ENCOUNTER — Telehealth: Payer: Self-pay

## 2017-12-15 LAB — GESTATIONAL GLUCOSE TOLERANCE
GLUCOSE 1 HOUR GTT: 142 mg/dL (ref 65–179)
GLUCOSE FASTING: 79 mg/dL (ref 65–94)
Glucose, GTT - 2 Hour: 112 mg/dL (ref 65–154)
Glucose, GTT - 3 Hour: 115 mg/dL (ref 65–139)

## 2017-12-15 NOTE — Telephone Encounter (Signed)
Selena BattenKim, RN Case Manager w/[employer] [facility]. She has been unable to reach the patient for her mid pregnancy call. Wants to verify when she was last seen, Weight, BP, of she passed her glucose test & had her flu & tdap vaccine. Cb# [phone] Ext [phone]. Can leave message on confidential voicemail.

## 2017-12-15 NOTE — Telephone Encounter (Signed)
LMVM Pt last seen by provider 12/06/17 wt 184, BP 118/74, patient failed 1 hr gtt, had 3 hr gtt yesterday & passed, will receive Tdap at 30 wk visit & had flu vac 09/30/17

## 2017-12-19 ENCOUNTER — Observation Stay
Admission: EM | Admit: 2017-12-19 | Discharge: 2017-12-19 | Disposition: A | Discharge status: Home / Self Care | Payer: 59 | Attending: Obstetrics and Gynecology | Admitting: Obstetrics and Gynecology

## 2017-12-19 ENCOUNTER — Other Ambulatory Visit: Payer: Self-pay

## 2017-12-19 ENCOUNTER — Telehealth: Payer: Self-pay

## 2017-12-19 DIAGNOSIS — O9989 Other specified diseases and conditions complicating pregnancy, childbirth and the puerperium: Secondary | ICD-10-CM | POA: Diagnosis not present

## 2017-12-19 DIAGNOSIS — N898 Other specified noninflammatory disorders of vagina: Secondary | ICD-10-CM

## 2017-12-19 DIAGNOSIS — O26893 Other specified pregnancy related conditions, third trimester: Secondary | ICD-10-CM | POA: Diagnosis not present

## 2017-12-19 DIAGNOSIS — Z3A30 30 weeks gestation of pregnancy: Secondary | ICD-10-CM | POA: Diagnosis not present

## 2017-12-19 LAB — ROM PLUS (ARMC ONLY): Rom Plus: NEGATIVE

## 2017-12-19 NOTE — Telephone Encounter (Signed)
Pt is 30w leaking clear liquid, no odor, drinks a lot of water.  [phone]  Adv pt to go to L&D via ED. Lori notified.

## 2017-12-19 NOTE — Discharge Summary (Signed)
Physician Final Progress Note  Patient ID: Stacey Curtis MRN: [id number] DOB/AGE: 07-31-1990 27 y.o.  Admit date: 12/19/2017 Admitting provider: Natale Milch, MD Discharge date: 12/19/2017   Admission Diagnoses: fluid leaking  Discharge Diagnoses:  Active Problems:   Indication for care in labor and delivery, antepartum IUP at 30 weeks, reactive NST, membranes intact  History of Present Illness: The patient is a 27 y.o. female G1P0000 at [redacted]w[redacted]d who presents for leaking fluid since last night. She felt the leaking 4 different times and described the amount as soaking her underwear. She did not wear a pad. She admits good fetal movement. She denies contractions or vaginal bleeding.    Past Medical History:  Diagnosis Date  . Anxiety and depression   . Common migraine   . Nephrolithiasis   . 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 Recurrent cystitis     Past Surgical History:  Procedure Laterality Date  . TONSILLECTOMY  2011    No current facility-administered medications on file prior to encounter.    Current Outpatient Medications on File Prior to Encounter  Medication Sig Dispense Refill  . nitrofurantoin (MACRODANTIN) 50 MG capsule Take 100 mg by mouth.    . Prenatal Vit-Fe Fumarate-FA (MULTIVITAMIN-PRENATAL) 27-0.8 MG TABS tablet Take 1 tablet by mouth daily at 12 noon.      Allergies  Allergen Reactions  . Cephalexin Anaphylaxis  . Sulfa Antibiotics Anaphylaxis    Social History   Socioeconomic History  . Marital status: Married    Spouse name: Not on file  . Number of children: 0  . Years of education: Not on file  . Highest education level: Not on file  Occupational History  . Occupation: Gaffer    Comment: [employer]  . Occupation: Passenger transport manager  Social Needs  . Financial resource strain: Not on file  . Food insecurity:    Worry: Not on file    Inability: Not  on file  . Transportation needs:    Medical: Not on file    Non-medical: Not on file  Tobacco Use  . Smoking status: Never Smoker  . Smokeless tobacco: Never Used  Substance and Sexual Activity  . Alcohol use: Yes    Comment: occasional  . Drug use: No  . Sexual activity: Yes    Partners: Male    Birth control/protection: None  Lifestyle  . Physical activity:    Days per week: 2 days    Minutes per session: 30 min  . Stress: Rather much  Relationships  . Social connections:    Talks on phone: More than three times a week    Gets together: Twice a week    Attends religious service: Never    Active member of club or organization: Yes    Attends meetings of clubs or organizations: More than 4 times per year    Relationship status: Married  . Intimate partner violence:    Fear of current or ex partner: No    Emotionally abused: No    Physically abused: No    Forced sexual activity: No  Other Topics Concern  . Not on file  Social History Narrative   Starting at [employer] for [employer] (fall 2015)                Family History  Problem Relation Age of Onset  . Cardiomyopathy Father   . Cardiomyopathy Paternal Grandfather   .  Melanoma Paternal Grandmother   . Cerebral palsy Cousin   . Diabetes Neg Hx   . Hypertension Neg Hx   . Stroke Neg Hx   . Thyroid disease Neg Hx      Review of Systems  Constitutional: Negative.   HENT: Negative.   Eyes: Negative.   Respiratory: Negative.   Cardiovascular: Negative.   Gastrointestinal: Negative.   Genitourinary:       Fluid leaking  Musculoskeletal: Negative.   Skin: Negative.   Neurological: Negative.   Endo/Heme/Allergies: Negative.   Psychiatric/Behavioral: Negative.      Physical Exam: Vital Signs: BP 118/65 (BP Location: Left Arm)   Pulse 92   Temp 98.4 F (36.9 C) (Oral)   Resp 18   Ht 5\' 5"  (1.651 m)   Wt 81.6 kg   LMP 05/16/2017   BMI 29.95 kg/m  Constitutional: Well nourished, well developed  female in no acute distress.  HEENT: normal Skin: Warm and dry.  Cardiovascular: Regular rate and rhythm.   Extremity: no edema  Respiratory: Clear to auscultation bilateral. Normal respiratory effort Abdomen: FHT present and non-tender to palpation Back: no CVAT Neuro: DTRs 2+, Cranial nerves grossly intact Psych: Alert and Oriented x3. No memory deficits. Normal mood and affect.  MS: normal gait, normal bilateral lower extremity ROM/strength/stability.  Pelvic exam:  is not limited by body habitus EGBUS: within normal limits Vagina: within normal limits and with normal mucosa blood, thin white discharge present Cervix: not evaluated Sterile speculum exam: negative for pooling, negative for ferning, no nitrazine availiable for testing Toco: negative for contractions Fetal well being: 135 bpm, moderate variability, +accelerations, -decelerations  Consults: None  Significant Findings/ Diagnostic Studies: labs:  Results for [name], [name] (MRN [id number]) as of 12/19/2017 21:30  Ref. Range 12/19/2017 19:17  Rom Plus Unknown NEGATIVE    Procedures: NST  Hospital Course: The patient was admitted to Labor and Delivery Triage for observation.   Discharge Condition: good  Disposition: Discharge disposition: 01-Home or Self Care       Diet: Regular diet  Discharge Activity: Activity as tolerated  Discharge Instructions    Discharge activity:  No Restrictions   Complete by:  As directed    Discharge diet:  No restrictions   Complete by:  As directed    Fetal Kick Count:  Lie on our left side for one hour after a meal, and count the number of times your baby kicks.  If it is less than 5 times, get up, move around and drink some juice.  Repeat the test 30 minutes later.  If it is still less than 5 kicks in an hour, notify your doctor.   Complete by:  As directed    No sexual activity restrictions   Complete by:  As directed    Notify physician for a general  feeling that "something is not right"   Complete by:  As directed    Notify physician for increase or change in vaginal discharge   Complete by:  As directed    Notify physician for intestinal cramps, with or without diarrhea, sometimes described as "gas pain"   Complete by:  As directed    Notify physician for leaking of fluid   Complete by:  As directed    Notify physician for low, dull backache, unrelieved by heat or Tylenol   Complete by:  As directed    Notify physician for menstrual like cramps   Complete by:  As directed    Notify  physician for pelvic pressure   Complete by:  As directed    Notify physician for uterine contractions.  These may be painless and feel like the uterus is tightening or the baby is  "balling up"   Complete by:  As directed    Notify physician for vaginal bleeding   Complete by:  As directed    PRETERM LABOR:  Includes any of the follwing symptoms that occur between 20 - [redacted] weeks gestation.  If these symptoms are not stopped, preterm labor can result in preterm delivery, placing your baby at risk   Complete by:  As directed      Allergies as of 12/19/2017      Reactions   Cephalexin Anaphylaxis   Sulfa Antibiotics Anaphylaxis      Medication List    STOP taking these medications   Doxylamine-Pyridoxine ER 20-20 MG Tbcr     TAKE these medications   multivitamin-prenatal 27-0.8 MG Tabs tablet Take 1 tablet by mouth daily at 12 noon.   nitrofurantoin 50 MG capsule Commonly known as:  MACRODANTIN Take 100 mg by mouth.      Follow-up Information    [facility]. Go to.   Specialty:  Obstetrics and Gynecology Why:  regular scheduled prenatal appointment Contact information: [address] [place] [phone]          Total time spent taking care of this patient: 20 minutes  Signed: Tresea MallJane Demarrius Curtis, CNM  12/19/2017, 9:20 PM

## 2017-12-19 NOTE — OB Triage Note (Signed)
Ms. Stacey Curtis here with c/o gush of fluid last night around 9PM, and again today 3 more times while at work, reports fluid as clear, odorless and occurring twice after emptying bladder. Amount enough to soak underwear, more than normal discharge.

## 2017-12-20 ENCOUNTER — Ambulatory Visit (INDEPENDENT_AMBULATORY_CARE_PROVIDER_SITE_OTHER): Payer: 59 | Admitting: Certified Nurse Midwife

## 2017-12-20 VITALS — BP 122/74 | Wt 186.0 lb

## 2017-12-20 DIAGNOSIS — Z23 Encounter for immunization: Secondary | ICD-10-CM | POA: Diagnosis not present

## 2017-12-20 DIAGNOSIS — Z3A30 30 weeks gestation of pregnancy: Secondary | ICD-10-CM

## 2017-12-20 DIAGNOSIS — Z3403 Encounter for supervision of normal first pregnancy, third trimester: Secondary | ICD-10-CM

## 2017-12-20 NOTE — Progress Notes (Signed)
ROB at 30wk2d: Seen in L&D last night for leakage of fluid. ROM Plus and fern test negative Baby active. Afraid that baby is going to be large-family history of macrasomic infants 3 hour GTT was normal TDAp today. Signed BT consent ROB in 2 weeks Farrel Connersolleen Felix Pratt, CNM

## 2017-12-20 NOTE — Progress Notes (Signed)
Pt went to hospital last night for LOF. TDAP and blood consent today.

## 2018-01-06 ENCOUNTER — Ambulatory Visit (INDEPENDENT_AMBULATORY_CARE_PROVIDER_SITE_OTHER): Payer: 59 | Admitting: Certified Nurse Midwife

## 2018-01-06 VITALS — BP 98/50 | Wt 189.0 lb

## 2018-01-06 DIAGNOSIS — Z3403 Encounter for supervision of normal first pregnancy, third trimester: Secondary | ICD-10-CM

## 2018-01-06 DIAGNOSIS — Z3A32 32 weeks gestation of pregnancy: Secondary | ICD-10-CM

## 2018-01-06 DIAGNOSIS — O26843 Uterine size-date discrepancy, third trimester: Secondary | ICD-10-CM

## 2018-01-06 LAB — POCT URINALYSIS DIPSTICK OB
GLUCOSE, UA: NEGATIVE
POC,PROTEIN,UA: NEGATIVE

## 2018-01-06 NOTE — Progress Notes (Signed)
ROB- hands and feet are pretty swollen for the last week

## 2018-01-06 NOTE — Progress Notes (Signed)
ROB at 32wk 5d: having some hand and pedal/ankle edema. Hands will go numb if raised above heart or after sleeping. The numbness and tingling resolves with position change. Complains of pedal and ankle swelling. Can still wear shoes/boots. Blood pressure WNL and no proteinuria. Recommend wrist braces when sleeping and elevating legs at the level of her heart when resting. Baby active Concerned about family history of macrosomia. Had another non medical ultrasound done and was told fetal growth 2 weeks ahead of time.  ROB in 2 weeks and ROB and growth scan in 4 weeks. Farrel Connersolleen Josemaria Brining, CNM

## 2018-01-11 DIAGNOSIS — Z3483 Encounter for supervision of other normal pregnancy, third trimester: Secondary | ICD-10-CM | POA: Diagnosis not present

## 2018-01-11 DIAGNOSIS — Z3482 Encounter for supervision of other normal pregnancy, second trimester: Secondary | ICD-10-CM | POA: Diagnosis not present

## 2018-01-20 ENCOUNTER — Encounter: Payer: Self-pay | Admitting: Obstetrics and Gynecology

## 2018-01-20 ENCOUNTER — Ambulatory Visit (INDEPENDENT_AMBULATORY_CARE_PROVIDER_SITE_OTHER): Payer: 59 | Admitting: Obstetrics and Gynecology

## 2018-01-20 VITALS — BP 104/60 | Wt 190.0 lb

## 2018-01-20 DIAGNOSIS — Z3A34 34 weeks gestation of pregnancy: Secondary | ICD-10-CM

## 2018-01-20 DIAGNOSIS — Z3403 Encounter for supervision of normal first pregnancy, third trimester: Secondary | ICD-10-CM

## 2018-01-20 NOTE — Progress Notes (Signed)
    Routine Prenatal Care Visit  Subjective  Stacey Curtis is a 27 y.o. G1P0000 at 2249w5d being seen today for ongoing prenatal care.  She is currently monitored for the following issues for this low-risk pregnancy and has COMMON MIGRAINE; FIBROCYSTIC BREAST DISEASE, MINIMAL; Disturbed concentration; Anxiety and depression; Supervision of low-risk first pregnancy; Recurrent cystitis; and Indication for care in labor and delivery, antepartum on their problem list.  ----------------------------------------------------------------------------------- Patient reports no complaints.   Contractions: Not present. Vag. Bleeding: None.  Movement: Present. Denies leaking of fluid.  ----------------------------------------------------------------------------------- The following portions of the patient's history were reviewed and updated as appropriate: allergies, current medications, past family history, past medical history, past social history, past surgical history and problem list. Problem list updated.   Objective  Blood pressure 104/60, weight 190 lb (86.2 kg), last menstrual period 05/16/2017. Pregravid weight 157 lb (71.2 kg) Total Weight Gain 33 lb (15 kg) Urinalysis:      Fetal Status: Fetal Heart Rate (bpm): 140 Fundal Height: 33 cm Movement: Present     General:  Alert, oriented and cooperative. Patient is in no acute distress.  Skin: Skin is warm and dry. No rash noted.   Cardiovascular: Normal heart rate noted  Respiratory: Normal respiratory effort, no problems with respiration noted  Abdomen: Soft, gravid, appropriate for gestational age. Pain/Pressure: Absent     Pelvic:  Cervical exam deferred        Extremities: Normal range of motion.     Mental Status: Normal mood and affect. Normal behavior. Normal judgment and thought content.     Assessment   27 y.o. G1P0000 at 1549w5d by  02/26/2018, by Ultrasound presenting for routine prenatal visit  Plan   First Pregnancy  Problems (from 07/07/17 to present)    Problem Noted Resolved   Supervision of low-risk first pregnancy 07/07/2017 by Stacey Curtis, CNM No   Overview Addendum 12/20/2017  9:10 PM by Stacey Curtis, CNM    [facility]inic [facility] Prenatal Labs  Dating 6wk 4d ultrasound Blood type: A/Positive/-- (06/13 1529)   Genetic Screen  AFP: Normal     NIPS: diploid XY Antibody:Negative (06/13 1529)  Anatomic US complete Rubella: 6.94 (06/13 1529) Varicella:Immune   GTT Third trimester:163 with 3-hr normal  79 / 142 / 112 / 115 RPR: Non Reactive (06/13 1529)   Rhogam Not needed HBsAg: Negative (06/13 1529)   TDaP vaccine    12/20/17                    Flu Shot: 09/30/17 HIV: Non Reactive (06/13 1529)   Baby Food    Breast                            GBS:   Contraception  Condoms Pap:2019 NIL  CBB      CS/VBAC    Support Person             Gestational age appropriate obstetric precautions including but not limited to vaginal bleeding, contractions, leaking of fluid and fetal movement were reviewed in detail with the patient.     Given breast feeding book and doula pamphlet. Discussed lactation consultation.   Return in about 1 week (around 01/27/2018) for ROB.  Stacey Milchhristanna R Schuman MD [facility], [facility] 01/20/2018, 9:13 AM

## 2018-01-20 NOTE — Progress Notes (Signed)
ROB- no concerns 

## 2018-01-27 ENCOUNTER — Ambulatory Visit (INDEPENDENT_AMBULATORY_CARE_PROVIDER_SITE_OTHER): Payer: 59 | Admitting: Certified Nurse Midwife

## 2018-01-27 VITALS — BP 110/60 | Wt 193.0 lb

## 2018-01-27 DIAGNOSIS — Z3403 Encounter for supervision of normal first pregnancy, third trimester: Secondary | ICD-10-CM

## 2018-01-27 DIAGNOSIS — Z3A35 35 weeks gestation of pregnancy: Secondary | ICD-10-CM

## 2018-01-27 LAB — POCT URINALYSIS DIPSTICK OB
Glucose, UA: NEGATIVE
POC,PROTEIN,UA: NEGATIVE

## 2018-01-27 NOTE — Progress Notes (Signed)
ROB

## 2018-01-27 NOTE — Progress Notes (Signed)
ROB at 35wk5d: +FM. Increasing hand and facial edema. BP WNL and negative proteinuria Feeling more pelvic pressure Cephalic presentation confirmed by US Breast/condoms ROB and growth scan in 1 week  Farrel Conners, CNM

## 2018-02-03 ENCOUNTER — Encounter: Payer: Self-pay | Admitting: Obstetrics and Gynecology

## 2018-02-03 ENCOUNTER — Other Ambulatory Visit (HOSPITAL_COMMUNITY)
Admission: RE | Admit: 2018-02-03 | Discharge: 2018-02-03 | Disposition: A | Discharge status: Home / Self Care | Payer: 59 | Source: Ambulatory Visit | Attending: Obstetrics and Gynecology | Admitting: Obstetrics and Gynecology

## 2018-02-03 ENCOUNTER — Ambulatory Visit (INDEPENDENT_AMBULATORY_CARE_PROVIDER_SITE_OTHER): Payer: 59 | Admitting: Obstetrics and Gynecology

## 2018-02-03 ENCOUNTER — Ambulatory Visit (INDEPENDENT_AMBULATORY_CARE_PROVIDER_SITE_OTHER): Payer: 59

## 2018-02-03 VITALS — BP 120/66 | Wt 197.0 lb

## 2018-02-03 DIAGNOSIS — Z3A36 36 weeks gestation of pregnancy: Secondary | ICD-10-CM | POA: Diagnosis not present

## 2018-02-03 DIAGNOSIS — Z3403 Encounter for supervision of normal first pregnancy, third trimester: Secondary | ICD-10-CM

## 2018-02-03 DIAGNOSIS — Z113 Encounter for screening for infections with a predominantly sexual mode of transmission: Secondary | ICD-10-CM | POA: Diagnosis present

## 2018-02-03 DIAGNOSIS — F32A Depression, unspecified: Secondary | ICD-10-CM

## 2018-02-03 DIAGNOSIS — F419 Anxiety disorder, unspecified: Secondary | ICD-10-CM

## 2018-02-03 DIAGNOSIS — O26843 Uterine size-date discrepancy, third trimester: Secondary | ICD-10-CM | POA: Diagnosis not present

## 2018-02-03 DIAGNOSIS — O99343 Other mental disorders complicating pregnancy, third trimester: Secondary | ICD-10-CM

## 2018-02-03 DIAGNOSIS — F329 Major depressive disorder, single episode, unspecified: Secondary | ICD-10-CM

## 2018-02-03 LAB — POCT URINALYSIS DIPSTICK OB
GLUCOSE, UA: NEGATIVE
POC,PROTEIN,UA: NEGATIVE

## 2018-02-03 LAB — OB RESULTS CONSOLE GC/CHLAMYDIA: Gonorrhea: NEGATIVE

## 2018-02-03 NOTE — Progress Notes (Signed)
Routine Prenatal Care Visit  Subjective  Stacey Curtis is a 28 y.o. G1P0000 at 6157w5d being seen today for ongoing prenatal care.  She is currently monitored for the following issues for this low-risk pregnancy and has COMMON MIGRAINE; FIBROCYSTIC BREAST DISEASE, MINIMAL; Disturbed concentration; Anxiety and depression; Supervision of low-risk first pregnancy; Recurrent cystitis; and Indication for care in labor and delivery, antepartum on their problem list.  ----------------------------------------------------------------------------------- Patient reports continued swelling.   Contractions: Not present. Vag. Bleeding: None.  Movement: Present. Denies leaking of fluid.  U/S today shows growth 38rd %ile, normal AFI.  AC > 97%ile.   ----------------------------------------------------------------------------------- The following portions of the patient's history were reviewed and updated as appropriate: allergies, current medications, past family history, past medical history, past social history, past surgical history and problem list. Problem list updated.   Objective  Blood pressure 120/66, weight 197 lb (89.4 kg), last menstrual period 05/16/2017. Pregravid weight 157 lb (71.2 kg) Total Weight Gain 40 lb (18.1 kg) Urinalysis: Urine Protein Negative  Urine Glucose Negative  Fetal Status: Fetal Heart Rate (bpm): Present   Movement: Present  Presentation: Vertex  General:  Alert, oriented and cooperative. Patient is in no acute distress.  Skin: Skin is warm and dry. No rash noted.   Cardiovascular: Normal heart rate noted  Respiratory: Normal respiratory effort, no problems with respiration noted  Abdomen: Soft, gravid, appropriate for gestational age. Pain/Pressure: Absent     Pelvic:  Cervical exam deferred        Extremities: Normal range of motion.  Edema: Mild pitting, slight indentation  Mental Status: Normal mood and affect. Normal behavior. Normal judgment and thought  content.   Assessment   28 y.o. G1P0000 at 3057w5d by  02/26/2018, by Ultrasound presenting for routine prenatal visit  Plan   First Pregnancy Problems (from 07/07/17 to present)    Problem Noted Resolved   Supervision of low-risk first pregnancy 07/07/2017 by Farrel ConnersGutierrez, Colleen, CNM No   Overview Addendum 12/20/2017  9:10 PM by Farrel ConnersGutierrez, Colleen, CNM    [facility]inic [facility] Prenatal Labs  Dating 6wk 4d ultrasound Blood type: A/Positive/-- (06/13 1529)   Genetic Screen  AFP: Normal     NIPS: diploid XY Antibody:Negative (06/13 1529)  Anatomic US complete Rubella: 6.94 (06/13 1529) Varicella:Immune   GTT Third trimester:163 with 3-hr normal  79 / 142 / 112 / 115 RPR: Non Reactive (06/13 1529)   Rhogam Not needed HBsAg: Negative (06/13 1529)   TDaP vaccine    12/20/17                    Flu Shot: 09/30/17 HIV: Non Reactive (06/13 1529)   Baby Food    Breast                            GBS:   Contraception  Condoms Pap:2019 NIL  CBB      CS/VBAC    Support Person           Anxiety and depression 10/15/2016 by Doreene Nestlark, Stacey Curtis, Stacey Curtis No       Preterm labor symptoms and general obstetric precautions including but not limited to vaginal bleeding, contractions, leaking of fluid and fetal movement were reviewed in detail with the patient. Please refer to After Visit Summary for other counseling recommendations.   - Discussed IOL at 39 weeks and she would like to do this given fear of size of baby with AC>97th %ile.  Reviewed BG tests and 3h was normal.  - discussed medication allergies. She states that she has taken PCN before without a problem.   Return in about 1 week (around 02/10/2018) for Routine Prenatal Appointment.  Thomasene MohairStephen Zahlia Deshazer, MD, Merlinda FrederickFACOG [facility], [facility] 02/03/2018 12:12 PM

## 2018-02-06 LAB — CERVICOVAGINAL ANCILLARY ONLY
CHLAMYDIA, DNA PROBE: NEGATIVE
Neisseria Gonorrhea: NEGATIVE

## 2018-02-07 LAB — STREP GP B CULTURE+RFLX: Strep Gp B Culture+Rflx: NEGATIVE

## 2018-02-10 ENCOUNTER — Encounter: Payer: Self-pay | Admitting: Obstetrics and Gynecology

## 2018-02-10 ENCOUNTER — Ambulatory Visit (INDEPENDENT_AMBULATORY_CARE_PROVIDER_SITE_OTHER): Payer: 59 | Admitting: Obstetrics and Gynecology

## 2018-02-10 VITALS — BP 110/70 | Wt 195.0 lb

## 2018-02-10 DIAGNOSIS — F32A Depression, unspecified: Secondary | ICD-10-CM

## 2018-02-10 DIAGNOSIS — F329 Major depressive disorder, single episode, unspecified: Secondary | ICD-10-CM

## 2018-02-10 DIAGNOSIS — F419 Anxiety disorder, unspecified: Secondary | ICD-10-CM

## 2018-02-10 DIAGNOSIS — Z3A37 37 weeks gestation of pregnancy: Secondary | ICD-10-CM

## 2018-02-10 DIAGNOSIS — O99343 Other mental disorders complicating pregnancy, third trimester: Secondary | ICD-10-CM

## 2018-02-10 DIAGNOSIS — Z3403 Encounter for supervision of normal first pregnancy, third trimester: Secondary | ICD-10-CM

## 2018-02-10 LAB — POCT URINALYSIS DIPSTICK OB
Glucose, UA: NEGATIVE
PROTEIN: NEGATIVE

## 2018-02-10 NOTE — Progress Notes (Signed)
  [facility] INDUCTION ASSESSMENT SCHEDULING Stacey Curtis [birth year] Medical record #: [id number] Phone #:  Home Phone [phone]  Mobile [phone]    [facility]atal Provider:[facility] Proposed admission date/time:02/20/2018 Method of induction:Cytotec  Weight: Filed Weights01/17/20 1134Weight:195 lb (88.5 kg) BMI Body mass index is 32.45 kg/m. HIV Negative HSV Negative EDC Estimated Date of Delivery: 2/2/20based on:US at [redacted] wks  Gestational age on admission: [redacted]w[redacted]d Gravidity/parity:G1P0000  Cervix Score   0 1 2 3   Position Posterior Midposition Anterior   Consistency Firm Medium Soft   Effacement (%) 0-30 40-50 60-70 >80  Dilation (cm) Closed 1-2 3-4 >5  Baby's station -3 -2 -1 +1, +2   Bishop Score:3   select indication(s) below Elective induction ?39 weeks primiparous patient  Medical Indications Adapted from ACOG Committee Opinion #[id number], "Medically Indicated Late Preterm and Early Term Deliveries," 2013.  PLACENTAL / UTERINE ISSUES FETAL ISSUES MATERNAL ISSUES  ? Placenta previa (36.0-37.6) ? Isoimmunization (37.0-38.6) ? Preeclampsia without severe features or gestational HTN (37.0)  ? Suspected accreta (34.0-35.6) ? Growth Restriction Mason Jim) ? Preeclampsia with severe features (34.0)  ? Prior classical CD, uterine window, rupture (36.0-37.6) ? Isolated (38.0-39.6) ? Chronic HTN (38.0-39.6)  ? Prior myomectomy (37.0-38.6) ? Concurrent findings (34.0-37.6) ? Cholestasis (37.0)  ? Umbilical vein varix (37.0) ? Growth Restriction (Twins) ? Diabetes  ? Placental abruption (chronic) ? Di-Di Isolated (36.0-37.6) ? Pregestational, controlled (39.0)  OBSTETRIC ISSUES ? Di-Di concurrent findings (32.0-34.6) ? Pregestational, uncontrolled (37.0-39.0)  ? Postdates ? (41 weeks) ? Mo-Di isolated (32.0-34.6) ? Pregestational, vascular compromise (37.0- 39.0)  ? PPROM (34.0) ? Multiple Gestation ? Gestational, diet controlled  (40.0)  ? Hx of IUFD (39.0 weeks) ? Di-Di (38.0-38.6) ? Gestational, med controlled (39.0)  ? Polyhydramnios, mild/moderate; SDV 8-16 or AFI 25-35 (39.0) ? Mo-Di (36.0-37.6) ? Gestational, uncontrolled (38.0-39.0)  ? Oligohydramnios (36.0-37.6); MVP <2 cm  For indications not listed above, delivery recommendations from maternal-fetal medicine consultant occurred on: Date:n/a with Dr. Gretta Cool for indication of:n/a  Provider Signature: Thomasene Mohair Scheduled by: Bethann Humble, RN Date:02/10/2018 12:15 PM   Call [phone] to finalize the induction date/time  [id number] (07/17)

## 2018-02-10 NOTE — Addendum Note (Signed)
Addended by: Donnetta Hail on: 02/10/2018 01:34 PM   Modules accepted: Orders

## 2018-02-10 NOTE — Progress Notes (Signed)
  Routine Prenatal Care Visit  Subjective  Stacey Curtis is a 28 y.o. G1P0000 at [redacted]w[redacted]d being seen today for ongoing prenatal care.  She is currently monitored for the following issues for this low-risk pregnancy and has COMMON MIGRAINE; FIBROCYSTIC BREAST DISEASE, MINIMAL; Disturbed concentration; Anxiety and depression; Supervision of low-risk first pregnancy; Recurrent cystitis; and Indication for care in labor and delivery, antepartum on their problem list.  ----------------------------------------------------------------------------------- Patient reports no complaints.   Contractions: Irregular. Vag. Bleeding: None.  Movement: Present. Denies leaking of fluid.  ----------------------------------------------------------------------------------- The following portions of the patient's history were reviewed and updated as appropriate: allergies, current medications, past family history, past medical history, past social history, past surgical history and problem list. Problem list updated.   Objective  Blood pressure 110/70, weight 195 lb (88.5 kg), last menstrual period 05/16/2017. Pregravid weight 157 lb (71.2 kg) Total Weight Gain 38 lb (17.2 kg) Urinalysis: Urine Protein    Urine Glucose    Fetal Status: Fetal Heart Rate (bpm): present Fundal Height: 38 cm Movement: Present  Presentation: Vertex  General:  Alert, oriented and cooperative. Patient is in no acute distress.  Skin: Skin is warm and dry. No rash noted.   Cardiovascular: Normal heart rate noted  Respiratory: Normal respiratory effort, no problems with respiration noted  Abdomen: Soft, gravid, appropriate for gestational age. Pain/Pressure: Absent     Pelvic:  Cervical exam performed Dilation: Closed Effacement (%): 50 Station: -3  Extremities: Normal range of motion.  Edema: Trace  Mental Status: Normal mood and affect. Normal behavior. Normal judgment and thought content.   Assessment   28 y.o. G1P0000 at  [redacted]w[redacted]d by  02/26/2018, by Ultrasound presenting for routine prenatal visit  Plan   First Pregnancy Problems (from 07/07/17 to present)    Problem Noted Resolved   Supervision of low-risk first pregnancy 07/07/2017 by Farrel Conners, CNM No   Overview Addendum 12/20/2017  9:10 PM by Farrel Conners, CNM    [facility]inic [facility] Prenatal Labs  Dating 6wk 4d ultrasound Blood type: A/Positive/-- (06/13 1529)   Genetic Screen  AFP: Normal     NIPS: diploid XY Antibody:Negative (06/13 1529)  Anatomic [place] complete Rubella: 6.94 (06/13 1529) Varicella:Immune   GTT Third trimester:163 with 3-hr normal  79 / 142 / 112 / 115 RPR: Non Reactive (06/13 1529)   Rhogam Not needed HBsAg: Negative (06/13 1529)   TDaP vaccine    12/20/17                    Flu Shot: 09/30/17 HIV: Non Reactive (06/13 1529)   Baby Food    Breast                            GBS:   Contraception  Condoms Pap:2019 NIL  CBB      CS/VBAC    Support Person         Anxiety and depression 10/15/2016 by Doreene Nest, NP No      Term labor symptoms and general obstetric precautions including but not limited to vaginal bleeding, contractions, leaking of fluid and fetal movement were reviewed in detail with the patient. Please refer to After Visit Summary for other counseling recommendations.   Return in about 1 week (around 02/17/2018) for Routine Prenatal Appointment.  Thomasene Mohair, MD, [facility], [facility] 02/10/2018 12:15 PM

## 2018-02-17 ENCOUNTER — Ambulatory Visit (INDEPENDENT_AMBULATORY_CARE_PROVIDER_SITE_OTHER): Payer: 59 | Admitting: Obstetrics and Gynecology

## 2018-02-17 ENCOUNTER — Encounter: Payer: Self-pay | Admitting: Obstetrics and Gynecology

## 2018-02-17 VITALS — BP 110/60 | Wt 193.0 lb

## 2018-02-17 DIAGNOSIS — Z3403 Encounter for supervision of normal first pregnancy, third trimester: Secondary | ICD-10-CM

## 2018-02-17 DIAGNOSIS — O9989 Other specified diseases and conditions complicating pregnancy, childbirth and the puerperium: Secondary | ICD-10-CM

## 2018-02-17 DIAGNOSIS — R300 Dysuria: Secondary | ICD-10-CM | POA: Diagnosis not present

## 2018-02-17 DIAGNOSIS — Z3A38 38 weeks gestation of pregnancy: Secondary | ICD-10-CM

## 2018-02-17 MED ORDER — METRONIDAZOLE 500 MG PO TABS: 500.0000 mg | ORAL_TABLET | Freq: Two times a day (BID) | ORAL | 0 refills | Status: DC

## 2018-02-17 NOTE — Progress Notes (Signed)
ROB C/o contractions for a few days off and on

## 2018-02-17 NOTE — Progress Notes (Signed)
    Routine Prenatal Care Visit  Subjective  Stacey Curtis is a 28 y.o. G1P0000 at [redacted]w[redacted]d being seen today for ongoing prenatal care.  She is currently monitored for the following issues for this low-risk pregnancy and has COMMON MIGRAINE; FIBROCYSTIC BREAST DISEASE, MINIMAL; Disturbed concentration; Anxiety and depression; Supervision of low-risk first pregnancy; Recurrent cystitis; and Indication for care in labor and delivery, antepartum on their problem list.  ----------------------------------------------------------------------------------- Patient reports no complaints.   Contractions: Irregular. Vag. Bleeding: None.  Movement: Present. Denies leaking of fluid.  ----------------------------------------------------------------------------------- The following portions of the patient's history were reviewed and updated as appropriate: allergies, current medications, past family history, past medical history, past social history, past surgical history and problem list. Problem list updated.   Objective  Blood pressure 110/60, weight 193 lb (87.5 kg), last menstrual period 05/16/2017. Pregravid weight 157 lb (71.2 kg) Total Weight Gain 36 lb (16.3 kg) Urinalysis:      Fetal Status: Fetal Heart Rate (bpm): 120 Fundal Height: 40 cm Movement: Present     General:  Alert, oriented and cooperative. Patient is in no acute distress.  Skin: Skin is warm and dry. No rash noted.   Cardiovascular: Normal heart rate noted  Respiratory: Normal respiratory effort, no problems with respiration noted  Abdomen: Soft, gravid, appropriate for gestational age. Pain/Pressure: Present     Pelvic:  Cervical exam performed Dilation: Closed Effacement (%): 50 Station: -3  Extremities: Normal range of motion.     Mental Status: Normal mood and affect. Normal behavior. Normal judgment and thought content.     Assessment   28 y.o. G1P0000 at [redacted]w[redacted]d by  02/26/2018, by Ultrasound presenting for routine  prenatal visit  Plan   First Pregnancy Problems (from 07/07/17 to present)    Problem Noted Resolved   Supervision of low-risk first pregnancy 07/07/2017 by Farrel Conners, CNM No   Overview Addendum 12/20/2017  9:10 PM by Farrel Conners, CNM    Clinic [facility] Prenatal Labs  Dating 6wk 4d ultrasound Blood type: A/Positive/-- (06/13 1529)   Genetic Screen  AFP: Normal     NIPS: diploid XY Antibody:Negative (06/13 1529)  Anatomic [place] complete Rubella: 6.94 (06/13 1529) Varicella:Immune   GTT Third trimester:163 with 3-hr normal  79 / 142 / 112 / 115 RPR: Non Reactive (06/13 1529)   Rhogam Not needed HBsAg: Negative (06/13 1529)   TDaP vaccine    12/20/17                    Flu Shot: 09/30/17 HIV: Non Reactive (06/13 1529)   Baby Food    Breast                            GBS:   Contraception  Condoms Pap:2019 NIL  CBB      CS/VBAC    Support Person           Anxiety and depression 10/15/2016 by Doreene Nest, NP No       Gestational age appropriate obstetric precautions including but not limited to vaginal bleeding, contractions, leaking of fluid and fetal movement were reviewed in detail with the patient.    Return if symptoms worsen or fail to improve.  Natale Milch MD [facility], [facility] 02/17/2018, 11:06 AM

## 2018-02-19 ENCOUNTER — Other Ambulatory Visit: Payer: Self-pay | Admitting: Obstetrics and Gynecology

## 2018-02-19 DIAGNOSIS — Z3403 Encounter for supervision of normal first pregnancy, third trimester: Secondary | ICD-10-CM

## 2018-02-19 LAB — URINE CULTURE

## 2018-02-19 NOTE — H&P (Signed)
History and Physical  HPI: Stacey PouchCatherine L Kell 28 y.o. female. G1P0000 6755w0d here for elective induction of labor related to large for gestational age estimate based off of a 36 week US. On 02/03/2018 the EFW was 3,483 grams and the abdominal circumference was greater than the 97%. Her pregnancy has been relatively uncomplicated. Of note she had an elevated 1hr GTT, and a normal 3hr GTT.   First Pregnancy Problems (from 07/07/17 to present)    Problem Noted Resolved   Supervision of low-risk first pregnancy 07/07/2017 by Farrel ConnersGutierrez, Colleen, CNM No   Overview Addendum 12/20/2017  9:10 PM by Farrel ConnersGutierrez, Colleen, CNM    Clinic [facility] Prenatal Labs  Dating 6wk 4d ultrasound Blood type: A/Positive/-- (06/13 1529)   Genetic Screen  AFP: Normal     NIPS: diploid XY Antibody:Negative (06/13 1529)  Anatomic US complete Rubella: 6.94 (06/13 1529) Varicella:Immune   GTT Third trimester:163 with 3-hr normal  79 / 142 / 112 / 115 RPR: Non Reactive (06/13 1529)   Rhogam Not needed HBsAg: Negative (06/13 1529)   TDaP vaccine    12/20/17                    Flu Shot: 09/30/17 HIV: Non Reactive (06/13 1529)   Baby Food    Breast                            GBS: Negative  Contraception  Condoms Pap:2019 NIL  CBB      CS/VBAC    Support Person           Anxiety and depression 10/15/2016 by Doreene Nestlark, Katherine K, NP No       Allergies  Allergen Reactions  . Cephalexin Anaphylaxis  . Sulfa Antibiotics Anaphylaxis    Past Medical History:  Diagnosis Date  . Anxiety and depression   . Common migraine   . Nephrolithiasis   . PATELLO-FEMORAL SYNDROME 08/16/2008   Qualifier: Diagnosis of  By: Patsy Lageropland MD, Karleen HampshireSpencer    . Recurrent cystitis     Past Surgical History:  Procedure Laterality Date  . TONSILLECTOMY  2011    Current Outpatient Medications  Medication Sig Dispense Refill  . Prenatal Vit-Fe Fumarate-FA (MULTIVITAMIN-PRENATAL) 27-0.8 MG TABS tablet Take 1 tablet by mouth daily at 12 noon.      No current facility-administered medications for this visit.     Social History   Socioeconomic History  . Marital status: Married    Spouse name: Not on file  . Number of children: 0  . Years of education: Not on file  . Highest education level: Not on file  Occupational History  . Occupation: GafferGraduate student    Comment: [employer]  . Occupation: Passenger transport managerHR/ talent acquisition  Social Needs  . Financial resource strain: Not on file  . Food insecurity:    Worry: Not on file    Inability: Not on file  . Transportation needs:    Medical: Not on file    Non-medical: Not on file  Tobacco Use  . Smoking status: Never Smoker  . Smokeless tobacco: Never Used  Substance and Sexual Activity  . Alcohol use: Yes    Comment: occasional  . Drug use: No  . Sexual activity: Yes    Partners: Male    Birth control/protection: None  Lifestyle  . Physical activity:    Days per week: 2 days    Minutes per session: 30 min  .  Stress: Rather much  Relationships  . Social connections:    Talks on phone: More than three times a week    Gets together: Twice a week    Attends religious service: Never    Active member of club or organization: Yes    Attends meetings of clubs or organizations: More than 4 times per year    Relationship status: Married  . Intimate partner violence:    Fear of current or ex partner: No    Emotionally abused: No    Physically abused: No    Forced sexual activity: No  Other Topics Concern  . Not on file  Social History Narrative   Starting at [employer] for [employer] (fall 2015)                Family History  Problem Relation Age of Onset  . Cardiomyopathy Father   . Cardiomyopathy Paternal Grandfather   . Melanoma Paternal Grandmother   . Cerebral palsy Cousin   . Diabetes Neg Hx   . Hypertension Neg Hx   . Stroke Neg Hx   . Thyroid disease Neg Hx     Review of Systems:10 point review of systems is negative as noted above.   Vitals: Last  menstrual period 05/16/2017.  Physical Exam: Physical Exam  Constitutional: She is oriented to person, place, and time. She appears well-developed and well-nourished.  HENT:  Head: Normocephalic and atraumatic.  Eyes: Pupils are equal, round, and reactive to light. EOM are normal.  Cardiovascular: Normal rate, regular rhythm and normal heart sounds.  Pulmonary/Chest: Effort normal and breath sounds normal.  Abdominal: Soft. She exhibits no distension. There is no abdominal tenderness.  Neurological: She is alert and oriented to person, place, and time.  Skin: Skin is warm and dry.  Psychiatric: She has a normal mood and affect. Her behavior is normal. Judgment and thought content normal.  Nursing note and vitals reviewed.    Assessment/Plan: 27yo G1P0000 2359w0d 1. IOL for LGA, will start with cytotec 2. GBS negative 3. Clear diet    Natale Milchhristanna R Schuman, MD 02/19/2018, 10:22 PM

## 2018-02-20 ENCOUNTER — Other Ambulatory Visit: Payer: Self-pay

## 2018-02-20 ENCOUNTER — Inpatient Hospital Stay
Admission: RE | Admit: 2018-02-20 | Discharge: 2018-02-24 | DRG: 786 | Disposition: A | Discharge status: Home / Self Care | Payer: 59 | Attending: Obstetrics and Gynecology | Admitting: Obstetrics and Gynecology

## 2018-02-20 DIAGNOSIS — Z98891 History of uterine scar from previous surgery: Secondary | ICD-10-CM

## 2018-02-20 DIAGNOSIS — O9081 Anemia of the puerperium: Secondary | ICD-10-CM | POA: Diagnosis not present

## 2018-02-20 DIAGNOSIS — O3663X0 Maternal care for excessive fetal growth, third trimester, not applicable or unspecified: Principal | ICD-10-CM | POA: Diagnosis present

## 2018-02-20 DIAGNOSIS — Z3A39 39 weeks gestation of pregnancy: Secondary | ICD-10-CM

## 2018-02-20 DIAGNOSIS — O411230 Chorioamnionitis, third trimester, not applicable or unspecified: Secondary | ICD-10-CM | POA: Diagnosis present

## 2018-02-20 DIAGNOSIS — D62 Acute posthemorrhagic anemia: Secondary | ICD-10-CM | POA: Diagnosis not present

## 2018-02-20 DIAGNOSIS — Z3403 Encounter for supervision of normal first pregnancy, third trimester: Secondary | ICD-10-CM

## 2018-02-20 DIAGNOSIS — F32A Depression, unspecified: Secondary | ICD-10-CM

## 2018-02-20 DIAGNOSIS — F419 Anxiety disorder, unspecified: Secondary | ICD-10-CM

## 2018-02-20 DIAGNOSIS — F329 Major depressive disorder, single episode, unspecified: Secondary | ICD-10-CM

## 2018-02-20 LAB — CBC
HCT: 35.6 % — ABNORMAL LOW (ref 36.0–46.0)
Hemoglobin: 12.1 g/dL (ref 12.0–15.0)
MCH: 30.2 pg (ref 26.0–34.0)
MCHC: 34 g/dL (ref 30.0–36.0)
MCV: 88.8 fL (ref 80.0–100.0)
Platelets: 229 10*3/uL (ref 150–400)
RBC: 4.01 MIL/uL (ref 3.87–5.11)
RDW: 12.2 % (ref 11.5–15.5)
WBC: 8.7 10*3/uL (ref 4.0–10.5)
nRBC: 0 % (ref 0.0–0.2)

## 2018-02-20 LAB — TYPE AND SCREEN
ABO/RH(D): A POS
ANTIBODY SCREEN: NEGATIVE

## 2018-02-20 MED ORDER — ZOLPIDEM TARTRATE 5 MG PO TABS
5.0000 mg | ORAL_TABLET | Freq: Every evening | ORAL | Status: DC | PRN
  Administered 2018-02-20: 5 mg via ORAL
  Filled 2018-02-20: qty 1

## 2018-02-20 MED ORDER — LACTATED RINGERS IV SOLN
INTRAVENOUS | Status: DC
  Administered 2018-02-20 – 2018-02-21 (×5): via INTRAVENOUS

## 2018-02-20 MED ORDER — BUTORPHANOL TARTRATE 2 MG/ML IJ SOLN: 1.0000 mg | INTRAMUSCULAR | Status: DC | PRN

## 2018-02-20 MED ORDER — MISOPROSTOL 25 MCG QUARTER TABLET
25.0000 ug | ORAL_TABLET | ORAL | Status: DC | PRN
  Administered 2018-02-20 (×2): 25 ug via VAGINAL
  Filled 2018-02-20 (×3): qty 1

## 2018-02-20 MED ORDER — TERBUTALINE SULFATE 1 MG/ML IJ SOLN: 0.2500 mg | Freq: Once | INTRAMUSCULAR | Status: DC | PRN

## 2018-02-20 MED ORDER — ONDANSETRON HCL 4 MG/2ML IJ SOLN
4.0000 mg | Freq: Four times a day (QID) | INTRAMUSCULAR | Status: DC | PRN
  Administered 2018-02-21 – 2018-02-22 (×3): 4 mg via INTRAVENOUS
  Filled 2018-02-20 (×2): qty 2

## 2018-02-20 MED ORDER — LIDOCAINE HCL (PF) 1 % IJ SOLN: 30.0000 mL | INTRAMUSCULAR | Status: DC | PRN

## 2018-02-20 MED ORDER — OXYTOCIN 40 UNITS IN NORMAL SALINE INFUSION - SIMPLE MED
1.0000 m[IU]/min | INTRAVENOUS | Status: DC
  Administered 2018-02-20 – 2018-02-21 (×2): 2 m[IU]/min via INTRAVENOUS
  Filled 2018-02-20 (×2): qty 1000

## 2018-02-20 MED ORDER — OXYTOCIN 40 UNITS IN NORMAL SALINE INFUSION - SIMPLE MED
2.5000 [IU]/h | INTRAVENOUS | Status: DC
  Administered 2018-02-22: 1 mL via INTRAVENOUS
  Administered 2018-02-22: 499 mL via INTRAVENOUS
  Filled 2018-02-20 (×2): qty 1000

## 2018-02-20 MED ORDER — SOD CITRATE-CITRIC ACID 500-334 MG/5ML PO SOLN: 30.0000 mL | ORAL | Status: DC | PRN

## 2018-02-20 MED ORDER — LIDOCAINE HCL (PF) 1 % IJ SOLN
INTRAMUSCULAR | Status: AC
  Filled 2018-02-20: qty 30

## 2018-02-20 MED ORDER — OXYTOCIN BOLUS FROM INFUSION: 500.0000 mL | Freq: Once | INTRAVENOUS | Status: DC

## 2018-02-20 MED ORDER — OXYTOCIN 10 UNIT/ML IJ SOLN
INTRAMUSCULAR | Status: AC
  Filled 2018-02-20: qty 2

## 2018-02-20 MED ORDER — AMMONIA AROMATIC IN INHA
RESPIRATORY_TRACT | Status: AC
  Filled 2018-02-20: qty 10

## 2018-02-20 MED ORDER — LACTATED RINGERS IV SOLN
500.0000 mL | INTRAVENOUS | Status: DC | PRN
  Administered 2018-02-21: 500 mL via INTRAVENOUS

## 2018-02-20 MED ORDER — MISOPROSTOL 200 MCG PO TABS
ORAL_TABLET | ORAL | Status: AC
  Administered 2018-02-20: 25 ug via VAGINAL
  Filled 2018-02-20: qty 4

## 2018-02-20 NOTE — Progress Notes (Signed)
L&D Progress Note  S: Comfortable now. Had a lot of pain and discomfort after the foley bulb was inserted Foley fell out after about 1 hour. Pain now much better. Has eaten and showered  O: BP 134/70 (BP Location: Left Arm)   Pulse 97   Temp 98.3 F (36.8 C) (Oral)   Resp 18   Ht 5\' 5"  (1.651 m)   Wt 87.1 kg   LMP 05/16/2017   BMI 31.95 kg/m   General: appears comfortable FHR: remains reactive with a baseline 130s, with accelerations to 150s to 160,  Toco: Contractions about every 4 minutes, mild Cervix: 3/50%/-2 after foley fell out, mid and to the left, difficult to exam due to patient intolerance of exams  A: Discussed options with patient to 1) sleep and restart Pitocin at 0500 or 2) to restart Pitocin now. Patient desires option 1  P: Ambien 5 mgm now Restart Pitocin at 0500. Saline lock tonight  Farrel Connersolleen Mavery Milling, CNM

## 2018-02-20 NOTE — Progress Notes (Signed)
L&D Progress Note.   S: Feeling some contractions. Has been out on the birthing ball  O: General: in NAD, appears fairly comfortable Vital signs: 122/66, 116/59  FHR: 130 with accelerations to 150s, moderate variability Toco: contractions every 2-3 min apart on 8 miu/min Pitocin  Cervix: 1/50%/-2 to -3  A: IOL in progress, slow cervical change  P: 30 ml foley bulb inserted in cervix Pitocin turned off Can eat supper and shower and ambulate  Farrel Conners, CNM

## 2018-02-20 NOTE — Progress Notes (Signed)
L&D Progress Note: Stacey Curtis is a G1 P0 who was admitted this Am for elective induction of labor for suspected macrosomia. EFW 3 weeks ago was 7#10oz. Received 2 doses of Cytotec 25 mcg vaginally. GBS negative S: Contractions feel mild. Ate some regular food for breakfast  O: BP 123/85 (BP Location: Left Arm)   Pulse 75   Temp 98 F (36.7 C) (Oral)   Resp 16   Ht 5\' 5"  (1.651 m)   Wt 87.1 kg   LMP 05/16/2017   BMI 31.95 kg/m    Abdomen: cephalic/ EFW 9# FHR: 140 baseline with accelerations to 160s to 170, moderate variability Toco: Contractions q2-3 minutes apart and mild to palpation Cervix: dimple/ 60-70%/-2  A: IUP at 39wk1d with IOL in progress FWB: cat 1 tracing  P: Start Pitocin Monitor progress and fetal maternal well being  Farrel Conners, CNM

## 2018-02-21 ENCOUNTER — Inpatient Hospital Stay: Payer: 59 | Admitting: Anesthesiology

## 2018-02-21 LAB — RPR: RPR Ser Ql: NONREACTIVE

## 2018-02-21 MED ORDER — FENTANYL 2.5 MCG/ML W/ROPIVACAINE 0.15% IN NS 100 ML EPIDURAL (ARMC)
EPIDURAL | Status: DC | PRN
  Administered 2018-02-21: 12 mL/h via EPIDURAL

## 2018-02-21 MED ORDER — LIDOCAINE HCL (PF) 1 % IJ SOLN
INTRAMUSCULAR | Status: DC | PRN
  Administered 2018-02-21: 3 mL

## 2018-02-21 MED ORDER — FENTANYL 2.5 MCG/ML W/ROPIVACAINE 0.15% IN NS 100 ML EPIDURAL (ARMC)
EPIDURAL | Status: AC
  Filled 2018-02-21: qty 100

## 2018-02-21 MED ORDER — LIDOCAINE-EPINEPHRINE (PF) 1.5 %-1:200000 IJ SOLN
INTRAMUSCULAR | Status: DC | PRN
  Administered 2018-02-21: 3 mL via PERINEURAL

## 2018-02-21 MED ORDER — BUPIVACAINE HCL (PF) 0.25 % IJ SOLN
INTRAMUSCULAR | Status: DC | PRN
  Administered 2018-02-21 (×2): 5 mL via EPIDURAL

## 2018-02-21 NOTE — Progress Notes (Signed)
   Subjective:  Feeling more painful contractions  Objective:   Vitals: Blood pressure 115/75, pulse 83, temperature (!) 97.5 F (36.4 C), temperature source Oral, resp. rate 16, height 5\' 5"  (1.651 m), weight 87.1 kg, last menstrual period 05/16/2017, SpO2 94 %. General: painfully contracting Abdomen: soft, non-tender Cervical Exam:  Dilation: 3 Effacement (%): 80 Cervical Position: Anterior Station: -2 Presentation: Vertex Exam by:: Laresha Bacorn Clear amniotic fluid  FHT: 140, moderate, +accels, no decels Toco: q1-52min  No results found for this or any previous visit (from the past 24 hour(s)).  Assessment:   28 y.o. G1P0000 [redacted]w[redacted]d elective IOL  Plan:   1) Labor - AROM, continue pitocin  2) Fetus - cat I tracing  [name], MD, [facility], [facility] 02/21/2018, 1:22 PM

## 2018-02-21 NOTE — Progress Notes (Signed)
   Subjective:  No concerns  Objective:   Vitals: Blood pressure (!) 118/58, pulse 64, temperature 98.5 F (36.9 C), temperature source Oral, resp. rate 14, height 5\' 5"  (1.651 m), weight 87.1 kg, last menstrual period 05/16/2017, SpO2 100 %. General:  Abdomen: Cervical Exam:  Dilation: 6 Effacement (%): 90 Cervical Position: Anterior Station: 0 Presentation: Vertex Exam by:: Bonney AidStaebler, MD  FHT: 150, moderate, +accels, occasional late decelerations Toco: q1-302min  No results found for this or any previous visit (from the past 24 hour(s)).  Assessment:   28 y.o. G1P0000 1233w2d elective IOL  Plan:   1) Labor - continue pitocin  2) Fetus - category II having occasional late deceleration.  Position changes  Stacey AustriaAndreas Abryana Lykens, MD, Stacey FrederickFACOG [facility], [facility] 02/21/2018, 8:16 PM

## 2018-02-21 NOTE — Progress Notes (Signed)
   Subjective:  Comfortable epidural in place  Objective:   Vitals: Blood pressure (!) 108/56, pulse 62, temperature 98.6 F (37 C), temperature source Oral, resp. rate 14, height 5\' 5"  (1.651 m), weight 87.1 kg, last menstrual period 05/16/2017, SpO2 99 %. General: NAD Abdomen: gravid, non-tender Cervical Exam:  Dilation: 4 Effacement (%): 90 Cervical Position: Anterior Station: -1 Presentation: Vertex Exam by:: Aaidyn San MD  FHT: 150, moderate, +accels, q56min Toco: q48min  No results found for this or any previous visit (from the past 24 hour(s)).  Assessment:   28 y.o. G1P0000 [redacted]w[redacted]d IOL elective  Plan:   1) Labor - continue pitocin, IUPC placed  2) Fetus - cat I tracing  [name], MD, [facility], [facility] 02/21/2018, 6:04 PM

## 2018-02-21 NOTE — Progress Notes (Signed)
   Subjective:  Comfortable with epidural, slightly more pressure with contractions  Objective:   Vitals: Blood pressure 109/86, pulse 64, temperature 99.3 F (37.4 C), temperature source Axillary, resp. rate 18, height 5\' 5"  (1.651 m), weight 87.1 kg, last menstrual period 05/16/2017, SpO2 100 %. General: NAD Abdomen: Gravid, non-tender Cervical Exam:  Dilation: 8 Effacement (%): 90 Cervical Position: Anterior Station: Plus 1 Presentation: Vertex Exam by:: Bonney Aid, MD  FHT: 155, moderate, +accles, no decels Toco: q34min  No results found for this or any previous visit (from the past 24 hour(s)).  Assessment:   28 y.o. G1P0000 [redacted]w[redacted]d elective IOL  Plan:   1) Labor - restart pitocin if contraction pattern spaces out,  Further cervical change and good descent  2) Fetus - cat I tracing  [name], MD, [facility], [facility] 02/21/2018, 11:23 PM

## 2018-02-21 NOTE — Progress Notes (Signed)
   Subjective:  Tearful, otherwise comfortable with epidural  Objective:   Vitals: Blood pressure 109/86, pulse 64, temperature 99.3 F (37.4 C), temperature source Axillary, resp. rate 18, height 5\' 5"  (1.651 m), weight 87.1 kg, last menstrual period 05/16/2017, SpO2 100 %. General: NAD Abdomen: gravid, non-tender Cervical Exam:  Dilation: 6.5 Effacement (%): 90 Cervical Position: Anterior Station: 0 Presentation: Vertex Exam by:: A. White, RN  FHT: 160, moderate, no accels, late decelerations Toco: q2-64min  No results found for this or any previous visit (from the past 24 hour(s)).  Assessment:   28 y.o. G1P0000 [redacted]w[redacted]d elective IOL  Plan:   1) Labor - pitocin off  2) Fetus - cat II tracing - pitocin off (lates resolved) - minimal cervical change - will re-evauate tracing in 1-hr with repeat cervical check  [name], MD, [facility], [facility] 02/21/2018, 10:21 PM

## 2018-02-21 NOTE — Anesthesia Procedure Notes (Signed)
Epidural Patient location during procedure: OB Start time: 02/21/2018 2:25 PM End time: 02/21/2018 2:46 PM  Staffing Resident/CRNA: Junious Silk, CRNA Performed: resident/CRNA   Preanesthetic Checklist Completed: patient identified, site marked, surgical consent, pre-op evaluation, timeout performed, IV checked, risks and benefits discussed and monitors and equipment checked  Epidural Patient position: sitting Prep: Betadine Patient monitoring: heart rate, continuous pulse ox and blood pressure Approach: midline Location: L4-L5 Injection technique: LOR saline  Needle:  Needle type: Tuohy  Needle gauge: 17 G Needle length: 9 cm and 9 Catheter type: closed end flexible Catheter size: 20 Guage Test dose: negative and 1.5% lidocaine with Epi 1:200 K  Assessment Sensory level: T10 Events: blood not aspirated, injection not painful, no injection resistance, negative IV test and no paresthesia  Additional Notes   Patient tolerated the insertion well without complications.Reason for block:procedure for pain

## 2018-02-21 NOTE — Progress Notes (Signed)
L&D Progress Note  S: Slept during the night after Ambien, but does not like the way the Ambien made her feel this AM. Pitocin restarted between 5Am and 5:30 AM. Now crying with contractions.Tried N2O2 but does not like it.  O: BP 107/68   Pulse 67   Temp 97.8 F (36.6 C) (Oral)   Resp 18   Ht 5\' 5"  (1.651 m)   Wt 87.1 kg   LMP 05/16/2017   SpO2 94%   BMI 31.95 kg/m    General: tearful FHR: baseline 130s, accelerations to 150s, moderate variability Toco: contractions every 2-3 minutes on 7 miu/min Pitocin  Cervix: 3/75%/-2 to -3/ med/anterior  A: IOL in progress  P: Continue Pitocin Offered Stadol or epidural for pain-patient states not ready for either.  Farrel Conners, CNM

## 2018-02-21 NOTE — Anesthesia Preprocedure Evaluation (Addendum)
Anesthesia Evaluation  Patient identified by MRN, date of birth, ID band Patient awake    Reviewed: H&P , NPO status , Patient's Chart, lab work & pertinent test results  History of Anesthesia Complications Negative for: history of anesthetic complications  Airway Mallampati: II  TM Distance: >3 FB Neck ROM: full    Dental no notable dental hx.    Pulmonary neg pulmonary ROS,    Pulmonary exam normal        Cardiovascular negative cardio ROS Normal cardiovascular exam     Neuro/Psych negative neurological ROS     GI/Hepatic negative GI ROS, Neg liver ROS,   Endo/Other  negative endocrine ROS  Renal/GU negative Renal ROS  negative genitourinary   Musculoskeletal   Abdominal   Peds  Hematology negative hematology ROS (+)   Anesthesia Other Findings   Reproductive/Obstetrics (+) Pregnancy                             Anesthesia Physical Anesthesia Plan  ASA: II  Anesthesia Plan: Epidural   Post-op Pain Management:    Induction:   PONV Risk Score and Plan:   Airway Management Planned:   Additional Equipment:   Intra-op Plan:   Post-operative Plan:   Informed Consent: I have reviewed the patients History and Physical, chart, labs and discussed the procedure including the risks, benefits and alternatives for the proposed anesthesia with the patient or authorized representative who has indicated his/her understanding and acceptance.       Plan Discussed with: CRNA and Anesthesiologist  Anesthesia Plan Comments:         Anesthesia Quick Evaluation

## 2018-02-22 ENCOUNTER — Encounter: Payer: Self-pay | Admitting: Obstetrics and Gynecology

## 2018-02-22 ENCOUNTER — Encounter
Admission: RE | Disposition: A | Discharge status: Home / Self Care | Payer: Self-pay | Source: Home / Self Care | Attending: Obstetrics and Gynecology

## 2018-02-22 DIAGNOSIS — O3663X0 Maternal care for excessive fetal growth, third trimester, not applicable or unspecified: Secondary | ICD-10-CM

## 2018-02-22 DIAGNOSIS — Z3A39 39 weeks gestation of pregnancy: Secondary | ICD-10-CM

## 2018-02-22 SURGERY — Surgical Case
Anesthesia: Epidural

## 2018-02-22 MED ORDER — LIDOCAINE HCL (PF) 2 % IJ SOLN
INTRAMUSCULAR | Status: AC
  Filled 2018-02-22: qty 30

## 2018-02-22 MED ORDER — MENTHOL 3 MG MT LOZG
1.0000 | LOZENGE | OROMUCOSAL | Status: DC | PRN
  Filled 2018-02-22: qty 9

## 2018-02-22 MED ORDER — OXYCODONE HCL 5 MG PO TABS
5.0000 mg | ORAL_TABLET | ORAL | Status: DC | PRN
  Administered 2018-02-23 (×4): 5 mg via ORAL
  Administered 2018-02-24: 10 mg via ORAL
  Administered 2018-02-24: 5 mg via ORAL
  Filled 2018-02-22: qty 2
  Filled 2018-02-22 (×2): qty 1
  Filled 2018-02-22: qty 2
  Filled 2018-02-22 (×2): qty 1

## 2018-02-22 MED ORDER — SODIUM CHLORIDE 0.9 % IV SOLN
500.0000 mg | INTRAVENOUS | Status: AC
  Administered 2018-02-22: 500 mg via INTRAVENOUS
  Filled 2018-02-22: qty 500

## 2018-02-22 MED ORDER — PROPOFOL 10 MG/ML IV BOLUS
INTRAVENOUS | Status: DC | PRN
  Administered 2018-02-22: 160 mg via INTRAVENOUS

## 2018-02-22 MED ORDER — BUPIVACAINE 0.25 % ON-Q PUMP DUAL CATH 400 ML
400.0000 mL | INJECTION | Status: DC
  Filled 2018-02-22: qty 400

## 2018-02-22 MED ORDER — SOD CITRATE-CITRIC ACID 500-334 MG/5ML PO SOLN
ORAL | Status: AC
  Administered 2018-02-22: 30 mL via ORAL
  Filled 2018-02-22: qty 15

## 2018-02-22 MED ORDER — SIMETHICONE 80 MG PO CHEW
80.0000 mg | CHEWABLE_TABLET | ORAL | Status: DC
  Filled 2018-02-22: qty 1

## 2018-02-22 MED ORDER — KETOROLAC TROMETHAMINE 15 MG/ML IJ SOLN
15.0000 mg | Freq: Four times a day (QID) | INTRAMUSCULAR | Status: DC
  Administered 2018-02-22: 15 mg via INTRAVENOUS
  Filled 2018-02-22 (×2): qty 1

## 2018-02-22 MED ORDER — KETOROLAC TROMETHAMINE 30 MG/ML IJ SOLN
15.0000 mg | Freq: Four times a day (QID) | INTRAMUSCULAR | Status: DC
  Administered 2018-02-22: 15 mg via INTRAVENOUS
  Filled 2018-02-22: qty 1

## 2018-02-22 MED ORDER — MORPHINE SULFATE (PF) 0.5 MG/ML IJ SOLN
INTRAMUSCULAR | Status: AC
  Filled 2018-02-22: qty 10

## 2018-02-22 MED ORDER — WITCH HAZEL-GLYCERIN EX PADS: 1.0000 "application " | MEDICATED_PAD | CUTANEOUS | Status: DC | PRN

## 2018-02-22 MED ORDER — FENTANYL CITRATE (PF) 100 MCG/2ML IJ SOLN
25.0000 ug | INTRAMUSCULAR | Status: DC | PRN
  Administered 2018-02-22 (×4): 25 ug via INTRAVENOUS

## 2018-02-22 MED ORDER — SIMETHICONE 80 MG PO CHEW
80.0000 mg | CHEWABLE_TABLET | Freq: Three times a day (TID) | ORAL | Status: DC
  Administered 2018-02-22 – 2018-02-24 (×7): 80 mg via ORAL
  Filled 2018-02-22 (×6): qty 1

## 2018-02-22 MED ORDER — SIMETHICONE 80 MG PO CHEW: 80.0000 mg | CHEWABLE_TABLET | ORAL | Status: DC | PRN

## 2018-02-22 MED ORDER — MIDAZOLAM HCL 2 MG/2ML IJ SOLN
INTRAMUSCULAR | Status: DC | PRN
  Administered 2018-02-22: 2 mg via INTRAVENOUS

## 2018-02-22 MED ORDER — ONDANSETRON HCL 4 MG/2ML IJ SOLN
INTRAMUSCULAR | Status: AC
  Filled 2018-02-22: qty 2

## 2018-02-22 MED ORDER — ONDANSETRON HCL 4 MG/2ML IJ SOLN
INTRAMUSCULAR | Status: DC | PRN
  Administered 2018-02-22: 4 mg via INTRAVENOUS

## 2018-02-22 MED ORDER — BUPIVACAINE HCL (PF) 0.5 % IJ SOLN
INTRAMUSCULAR | Status: AC
  Filled 2018-02-22: qty 30

## 2018-02-22 MED ORDER — OXYTOCIN 40 UNITS IN NORMAL SALINE INFUSION - SIMPLE MED
2.5000 [IU]/h | INTRAVENOUS | Status: DC
  Administered 2018-02-22: 2.5 [IU]/h via INTRAVENOUS
  Filled 2018-02-22: qty 1000

## 2018-02-22 MED ORDER — SOD CITRATE-CITRIC ACID 500-334 MG/5ML PO SOLN
30.0000 mL | ORAL | Status: AC
  Administered 2018-02-22: 30 mL via ORAL

## 2018-02-22 MED ORDER — PROPOFOL 10 MG/ML IV BOLUS
INTRAVENOUS | Status: AC
  Filled 2018-02-22: qty 20

## 2018-02-22 MED ORDER — FENTANYL CITRATE (PF) 100 MCG/2ML IJ SOLN
INTRAMUSCULAR | Status: AC
  Administered 2018-02-22: 25 ug via INTRAVENOUS
  Filled 2018-02-22: qty 2

## 2018-02-22 MED ORDER — SUCCINYLCHOLINE CHLORIDE 20 MG/ML IJ SOLN
INTRAMUSCULAR | Status: DC | PRN
  Administered 2018-02-22: 100 mg via INTRAVENOUS

## 2018-02-22 MED ORDER — MORPHINE SULFATE (PF) 2 MG/ML IV SOLN: 1.0000 mg | INTRAVENOUS | Status: DC | PRN

## 2018-02-22 MED ORDER — BUPIVACAINE HCL (PF) 0.5 % IJ SOLN
INTRAMUSCULAR | Status: DC | PRN
  Administered 2018-02-22: 10 mL

## 2018-02-22 MED ORDER — DIPHENHYDRAMINE HCL 25 MG PO CAPS: 25.0000 mg | ORAL_CAPSULE | Freq: Four times a day (QID) | ORAL | Status: DC | PRN

## 2018-02-22 MED ORDER — METHYLERGONOVINE MALEATE 0.2 MG/ML IJ SOLN
INTRAMUSCULAR | Status: AC
  Filled 2018-02-22: qty 1

## 2018-02-22 MED ORDER — DIBUCAINE 1 % RE OINT: 1.0000 "application " | TOPICAL_OINTMENT | RECTAL | Status: DC | PRN

## 2018-02-22 MED ORDER — COCONUT OIL OIL
1.0000 "application " | TOPICAL_OIL | Status: DC | PRN
  Administered 2018-02-22: 1 via TOPICAL
  Filled 2018-02-22: qty 120

## 2018-02-22 MED ORDER — LACTATED RINGERS IV SOLN: INTRAVENOUS | Status: DC

## 2018-02-22 MED ORDER — OXYCODONE-ACETAMINOPHEN 5-325 MG PO TABS: 1.0000 | ORAL_TABLET | ORAL | Status: DC | PRN

## 2018-02-22 MED ORDER — GENTAMICIN SULFATE 40 MG/ML IJ SOLN
5.0000 mg/kg | INTRAVENOUS | Status: DC
  Filled 2018-02-22: qty 8.75

## 2018-02-22 MED ORDER — FENTANYL CITRATE (PF) 100 MCG/2ML IJ SOLN
INTRAMUSCULAR | Status: DC | PRN
  Administered 2018-02-22: 100 ug via INTRAVENOUS

## 2018-02-22 MED ORDER — METHYLERGONOVINE MALEATE 0.2 MG/ML IJ SOLN
INTRAMUSCULAR | Status: DC | PRN
  Administered 2018-02-22: 0.2 mg via INTRAMUSCULAR

## 2018-02-22 MED ORDER — BUPIVACAINE HCL 0.5 % IJ SOLN
30.0000 mL | INTRAMUSCULAR | Status: DC
  Filled 2018-02-22: qty 30

## 2018-02-22 MED ORDER — SENNOSIDES-DOCUSATE SODIUM 8.6-50 MG PO TABS
2.0000 | ORAL_TABLET | ORAL | Status: DC
  Administered 2018-02-23 – 2018-02-24 (×2): 2 via ORAL
  Filled 2018-02-22 (×2): qty 2

## 2018-02-22 MED ORDER — LIDOCAINE HCL (PF) 2 % IJ SOLN
INTRAMUSCULAR | Status: DC | PRN
  Administered 2018-02-22: 8 mL via INTRADERMAL

## 2018-02-22 MED ORDER — ONDANSETRON HCL 4 MG/2ML IJ SOLN: 4.0000 mg | Freq: Four times a day (QID) | INTRAMUSCULAR | Status: DC | PRN

## 2018-02-22 MED ORDER — MORPHINE SULFATE (PF) 0.5 MG/ML IJ SOLN
INTRAMUSCULAR | Status: DC | PRN
  Administered 2018-02-22: 3 mg via EPIDURAL

## 2018-02-22 MED ORDER — IBUPROFEN 800 MG PO TABS
800.0000 mg | ORAL_TABLET | Freq: Three times a day (TID) | ORAL | Status: DC
  Administered 2018-02-22 – 2018-02-24 (×6): 800 mg via ORAL
  Filled 2018-02-22 (×6): qty 1

## 2018-02-22 MED ORDER — LACTATED RINGERS IV SOLN
INTRAVENOUS | Status: DC | PRN
  Administered 2018-02-22: 05:00:00 via INTRAVENOUS

## 2018-02-22 MED ORDER — FENTANYL 2.5 MCG/ML W/ROPIVACAINE 0.15% IN NS 100 ML EPIDURAL (ARMC)
EPIDURAL | Status: AC
  Filled 2018-02-22: qty 100

## 2018-02-22 MED ORDER — KETOROLAC TROMETHAMINE 15 MG/ML IJ SOLN
INTRAMUSCULAR | Status: AC
  Administered 2018-02-22: 15 mg via INTRAVENOUS
  Filled 2018-02-22: qty 1

## 2018-02-22 MED ORDER — CLINDAMYCIN PHOSPHATE 900 MG/50ML IV SOLN
INTRAVENOUS | Status: AC
  Administered 2018-02-22: 900 mg via INTRAVENOUS
  Filled 2018-02-22: qty 50

## 2018-02-22 MED ORDER — FENTANYL CITRATE (PF) 100 MCG/2ML IJ SOLN
INTRAMUSCULAR | Status: AC
  Filled 2018-02-22: qty 2

## 2018-02-22 MED ORDER — PRENATAL MULTIVITAMIN CH
1.0000 | ORAL_TABLET | Freq: Every day | ORAL | Status: DC
  Administered 2018-02-23: 1 via ORAL
  Filled 2018-02-22 (×2): qty 1

## 2018-02-22 MED ORDER — ACETAMINOPHEN 325 MG PO TABS
650.0000 mg | ORAL_TABLET | ORAL | Status: DC | PRN
  Administered 2018-02-22 – 2018-02-24 (×6): 650 mg via ORAL
  Filled 2018-02-22 (×6): qty 2

## 2018-02-22 MED ORDER — ONDANSETRON HCL 4 MG/2ML IJ SOLN: 4.0000 mg | Freq: Once | INTRAMUSCULAR | Status: DC | PRN

## 2018-02-22 MED ORDER — SUCCINYLCHOLINE CHLORIDE 20 MG/ML IJ SOLN
INTRAMUSCULAR | Status: AC
  Filled 2018-02-22: qty 1

## 2018-02-22 MED ORDER — LIDOCAINE 2% (20 MG/ML) 5 ML SYRINGE
INTRAMUSCULAR | Status: DC | PRN
  Administered 2018-02-22 (×4): 5 mg via INTRAVENOUS

## 2018-02-22 MED ORDER — CLINDAMYCIN PHOSPHATE 900 MG/50ML IV SOLN
900.0000 mg | INTRAVENOUS | Status: AC
  Administered 2018-02-22: 900 mg via INTRAVENOUS

## 2018-02-22 MED ORDER — IBUPROFEN 800 MG PO TABS: 800.0000 mg | ORAL_TABLET | Freq: Three times a day (TID) | ORAL | Status: DC

## 2018-02-22 MED ORDER — MIDAZOLAM HCL 2 MG/2ML IJ SOLN
INTRAMUSCULAR | Status: AC
  Filled 2018-02-22: qty 2

## 2018-02-22 SURGICAL SUPPLY — 31 items
ADH SKN CLS APL DERMABOND .7 (GAUZE/BANDAGES/DRESSINGS) ×1
BAG COUNTER SPONGE EZ (MISCELLANEOUS) ×2 IMPLANT
BAG SPNG 4X4 CLR HAZ (MISCELLANEOUS) ×1
CANISTER SUCT 3000ML PPV (MISCELLANEOUS) ×2 IMPLANT
CATH KIT ON-Q SILVERSOAK 5 (CATHETERS) ×2 IMPLANT
CATH KIT ON-Q SILVERSOAK 5IN (CATHETERS) ×4 IMPLANT
CHLORAPREP W/TINT 26ML (MISCELLANEOUS) ×4 IMPLANT
DERMABOND ADVANCED (GAUZE/BANDAGES/DRESSINGS) ×1
DERMABOND ADVANCED .7 DNX12 (GAUZE/BANDAGES/DRESSINGS) ×1 IMPLANT
DRSG OPSITE POSTOP 4X10 (GAUZE/BANDAGES/DRESSINGS) ×2 IMPLANT
DRSG TELFA 3X8 NADH (GAUZE/BANDAGES/DRESSINGS) ×2 IMPLANT
ELECT CAUTERY BLADE 6.4 (BLADE) ×2 IMPLANT
ELECT REM PT RETURN 9FT ADLT (ELECTROSURGICAL) ×2
ELECTRODE REM PT RTRN 9FT ADLT (ELECTROSURGICAL) ×1 IMPLANT
GAUZE SPONGE 4X4 12PLY STRL (GAUZE/BANDAGES/DRESSINGS) ×2 IMPLANT
GLOVE BIO SURGEON STRL SZ7 (GLOVE) ×2 IMPLANT
GLOVE INDICATOR 7.5 STRL GRN (GLOVE) ×2 IMPLANT
GOWN STRL REUS W/ TWL LRG LVL3 (GOWN DISPOSABLE) ×3 IMPLANT
GOWN STRL REUS W/TWL LRG LVL3 (GOWN DISPOSABLE) ×6
NS IRRIG 1000ML POUR BTL (IV SOLUTION) ×2 IMPLANT
PACK C SECTION AR (MISCELLANEOUS) ×2 IMPLANT
PAD DRESSING TELFA 3X8 NADH (GAUZE/BANDAGES/DRESSINGS) ×1 IMPLANT
PAD OB MATERNITY 4.3X12.25 (PERSONAL CARE ITEMS) ×2 IMPLANT
PAD PREP 24X41 OB/GYN DISP (PERSONAL CARE ITEMS) ×2 IMPLANT
STAPLER INSORB 30 2030 C-SECTI (MISCELLANEOUS) ×1 IMPLANT
STRIP CLOSURE SKIN 1/2X4 (GAUZE/BANDAGES/DRESSINGS) ×2 IMPLANT
SUT MNCRL AB 4-0 PS2 18 (SUTURE) ×2 IMPLANT
SUT PDS AB 1 TP1 96 (SUTURE) ×4 IMPLANT
SUT VIC AB 0 CTX 36 (SUTURE) ×4
SUT VIC AB 0 CTX36XBRD ANBCTRL (SUTURE) ×2 IMPLANT
SUT VIC AB 2-0 CT1 36 (SUTURE) ×2 IMPLANT

## 2018-02-22 NOTE — Progress Notes (Signed)
   Subjective:  Doing well no concerns, comfortable after having epidural redosed  Objective:   Vitals: Blood pressure (!) 115/45, pulse 65, temperature 99.4 F (37.4 C), temperature source Axillary, resp. rate 18, height 5\' 5"  (1.651 m), weight 87.1 kg, last menstrual period 05/16/2017, SpO2 99 %. General: Vomitting Abdomen: gravid, soft, non-tender Cervical Exam:  Dilation: Lip/rim Effacement (%): 90 Cervical Position: Anterior Station: Plus 1 Presentation: Vertex Exam by:: Bonney Aid, MD  FHT: 145, moderate, +accels, no decels Toco: q69min  No results found for this or any previous visit (from the past 24 hour(s)).  Assessment:   28 y.o. G1P0000 [redacted]w[redacted]d elective IOL  Plan:   1) Labor - continues to make cervical change  2) Fetus - cat I tracing  - 36 week5 day  ultrasound 3483g giving an EFW of 3843 or 82.9%ile and AC 97.7%ile giving a current EFW of 8lbs 8oz - TWG 16.3 kg or 36lbs - 1-hr 163 with 3-hr 79 / 142 / 113 / 115   [name], MD, [facility], [facility] 02/22/2018, 1:42 AM

## 2018-02-22 NOTE — Op Note (Addendum)
Preoperative Diagnosis: 1) 28 y.o. G1P0000 at 2475w3d 2) Active phase arrest   Postoperative Diagnosis: 1) 28 y.o. G1P0000 at 6175w3d 2) Active phase arrest  Operation Performed: Primary low transverse C-section via pfannenstiel skin incision  Indication: Active phase arrest at 9.5cm for 3-hrs, general anesthesia secondary to patient reporting feeling pain on testing.  Anesthesia: General   Primary Surgeon: Vena AustriaAndreas Mayda Shippee, MD  Preoperative Antibiotics: Gentamycin, clindamycin, and azithromycin  Estimated Blood Loss: 800mL  IV Fluids: 900mL  Drains or Tubes: Foley to gravity drainage, ON-Q catheter system  Implants: none  Specimens Removed: none  Complications: none  Intraoperative Findings:  Normal tubes ovaries and uterus.  Delivery resulted in the birth of a liveborn female, APGAR (1 MIN):  1 APGAR (5 MINS):  8, weight 8lbs 0oz.  Some slight uterine atony with 0.2mg  of IM methergine administered intraoperatively.  Patient Condition: stable  Procedure in Detail:  Patient was taken to the operating room were she was administered regional anesthesia.  She was positioned in the supine position, prepped and draped in the  Usual sterile fashion.  Prior to proceeding with the case a time out was performed and the level of anesthetic was checked and noted to be in-adequate at which time decision was made to proceed under general anesthesia.  Following intubation and utilizing the scalpel a pfannenstiel skin incision was made 2cm above the pubic symphysis.  This was carried down sharply to the the level of the rectus fascia.  The fascia was scored in the midline and bluntly separated.    The midline was identified, the peritoneum was entered bluntly and expanded using manual tractions.  The uterus was noted to be in a none rotated position.  Next the bladder blade was placed retracting the bladder caudally.  A bladder flap was not created.  A low transverse incision was scored on the lower  uterine segment.  The hysterotomy was entered bluntly using the operators finger.  The hysterotomy incision was extended using manual traction.  The operators hand was placed within the hysterotomy position noting the fetus to be within the right occiput anterior position.  The vertex was grasped, flexed, brought to the incision, and delivered a traumatically using fundal pressure.  The remainder of the body delivered with ease.  The infant was suctioned, cord was clamped and cut before handing off to the awaiting neonatologist.  The placenta was delivered using manual extraction.  The uterus was exteriorized, wiped clean of clots and debris using two moist laps.  There was some degree of uterine atony and 0.2mg  of IM methergine was administered which along with the postpartum pitocin and fundal massage resulted in good uterine tone.  The hysterotomy was closed using a two layer closure of 0 Vicryl, with the first being a running locked, the second a vertical imbricating.  The uterus was returned to the abdomen.  The peritoneal gutters were wiped clean of clots and debris using two moist laps.  The hysterotomy incision was re-inspected noted to be hemostatic.   The rectus muscles were inspected noted to be hemostatic.  The superior border of the rectus fascia was grasped with a Kocher clamp.  The ON-Q trocars were then placed 4cm above the superior border of the incision and tunneled subfascially.  The introducers were removed and the catheters were threaded through the sleeves after which the sleeves were removed.  The fascia was closed using a looped #1 PDS in a running fashion taking 1cm by 1cm bites.  The  subcutaneous tissue was irrigated using warm saline, hemostasis achieved using the bovie.  The subcutaneous dead space was less than 3cm and was not closed.  The skin was closed using Insorb staples.  Sponge needle and instrument counts were corrects times two.  The patient tolerated the procedure well and was  taken to the recovery room in stable condition.

## 2018-02-22 NOTE — Progress Notes (Signed)
POD#0 pLTCS Subjective:  Skin-to-skin with baby, has just spoken with lactation and had a successful breastfeed. Foley in place with adequate output. SCDs on. She denies pain at this time. She is feeling very tired.  Objective:  Blood pressure 117/84, pulse 80, temperature 98.5 F (36.9 C), temperature source Oral, resp. rate 15, height 5\' 5"  (1.651 m), weight 87.1 kg, last menstrual period 05/16/2017, SpO2 96 %.  General: NAD Pulmonary: no increased work of breathing Abdomen: non-distended, non-tender Incision:Dressing C/D/I Extremities: no edema, no erythema, no tenderness  Results for orders placed or performed during the hospital encounter of 02/20/18 (from the past 72 hour(s))  CBC     Status: Abnormal   Collection Time: 02/20/18  1:01 AM  Result Value Ref Range   WBC 8.7 4.0 - 10.5 K/uL   RBC 4.01 3.87 - 5.11 MIL/uL   Hemoglobin 12.1 12.0 - 15.0 g/dL   HCT 60.6 (L) 00.4 - 59.9 %   MCV 88.8 80.0 - 100.0 fL   MCH 30.2 26.0 - 34.0 pg   MCHC 34.0 30.0 - 36.0 g/dL   RDW 77.4 14.2 - 39.5 %   Platelets 229 150 - 400 K/uL   nRBC 0.0 0.0 - 0.2 %    Comment: Performed at [facility], [address] [place], [place] [address]  RPR     Status: None   Collection Time: 02/20/18  1:01 AM  Result Value Ref Range   RPR Ser Ql Non Reactive Non Reactive    Comment: (NOTE) Performed At: [facility] [address] [place], [place] [id number] Jolene Schimke MD [id number]   Type and screen     Status: None   Collection Time: 02/20/18  1:46 AM  Result Value Ref Range   ABO/RH(D) A POS    Antibody Screen NEG    Sample Expiration      02/23/2018 Performed at [facility], [address] [place], [place] [address]      Assessment:   28 y.o. G1P0000 postoperative day #0 in stable condition   Plan:  1)  Blood Type --/--/A POS (01/27 0146) / Rubella 6.94 (06/13 1529) / Varicella Immune  2) TDAP status: received antepartum 12/20/2017  3)  Breastfeeding / Contraception: not discussed   4) Disposition: continue post-operative care. Encouraged early ambulation.  Marcelyn Bruins, CNM 02/22/2018

## 2018-02-22 NOTE — OR Nursing (Signed)
On-Q pump attached by L&D nurse.

## 2018-02-22 NOTE — Anesthesia Post-op Follow-up Note (Signed)
Anesthesia QCDR form completed.        

## 2018-02-22 NOTE — Progress Notes (Signed)
   Subjective:    Objective:   Vitals: Blood pressure (!) 115/45, pulse 65, temperature 100.3 F (37.9 C), temperature source Axillary, resp. rate 18, height 5\' 5"  (1.651 m), weight 87.1 kg, last menstrual period 05/16/2017, SpO2 99 %. General: NAD Abdomen: gravid, non-tender Cervical Exam:  Dilation: Lip/rim Effacement (%): 90 Cervical Position: Anterior Station: Plus 1 Presentation: Vertex Exam by:: Bonney Aid, MD  FHT: 130, moderate, +accels, no decels Toco: q15min  No results found for this or any previous visit (from the past 24 hour(s)).  Assessment:   28 y.o. G1P0000 [redacted]w[redacted]d elective IOL  Plan:   1) Labor - second stage arrest no change in anterior lip over 3-hrs. Marland Kitchen Supected CPD as the cause given EFW close to 90%ile.  The patient was counseled regarding risk and benefits to proceeding with Cesarean section to expedite delivery.  Risk of cesarean section were discussed including risk of bleeding and need for potential intraoperative or postoperative blood transfusion with a rate of approximately 5% quoted for all Cesarean sections, risk of injury to adjacent organs including but not limited to bowl and bladder, the need for additional surgical procedures to address such injuries, and the risk of infection.  The risk of continued attempts at vaginal delivery include but are note limited to worsening fetal or maternal status.  After consideration of options the patient is amenable to proceed with primary cesarean section for delivery.  2) Fetus - cat I tracing   [name], MD, [facility], [facility] 02/22/2018, 4:14 AM

## 2018-02-22 NOTE — Discharge Summary (Signed)
Obstetric Discharge Summary Reason for Admission: induction of labor Prenatal Procedures: none Intrapartum Procedures: cesarean: low cervical, transverse for second stage arrest Postpartum Procedures: none Complications-Operative and Postpartum: none Hemoglobin  Date Value Ref Range Status  02/23/2018 8.6 (L) 12.0 - 15.0 g/dL Final  65/46/5035 46.5 11.1 - 15.9 g/dL Final   HCT  Date Value Ref Range Status  02/23/2018 25.8 (L) 36.0 - 46.0 % Final   Hematocrit  Date Value Ref Range Status  12/06/2017 33.6 (L) 34.0 - 46.6 % Final    Physical Exam:  General: alert, cooperative, appears stated age and no distress Lochia: appropriate Uterine Fundus: firm Incision: no significant drainage DVT Evaluation: No evidence of DVT seen on physical exam.  Discharge Diagnoses: Term Pregnancy-delivered  Discharge Information: Date: 02/24/2018 Activity: pelvic rest and no lifting more than 10 pounds for 6 weeks and no driving or tub baths for 2 weeks Diet: routine Allergies as of 02/24/2018      Reactions   Cephalexin Anaphylaxis   Sulfa Antibiotics Anaphylaxis      Medication List    TAKE these medications   multivitamin-prenatal 27-0.8 MG Tabs tablet Take 1 tablet by mouth daily at 12 noon.   oxyCODONE 5 MG immediate release tablet Commonly known as:  Oxy IR/ROXICODONE Take 1 tablet (5 mg total) by mouth every 6 (six) hours as needed for up to 5 days for severe pain.            Discharge Care Instructions  (From admission, onward)         Start     Ordered   02/24/18 0000  Discharge wound care:    Comments:  Keep incision dry, clean.   02/24/18 1037          Condition: stable Discharge to: home Follow-up Information    [name], MD. Go on 03/01/2018.   Specialty:  Obstetrics and Gynecology Why:  Postpartum follow up appointment on Wednesday February 5th at 1:50 PM with Dr. Bonney Aid for an incision check Contact information: [address] [place] [address] [phone]           Newborn Data: Live born female  Birth Weight: 8 lb 0.4 oz (3640 g) APGAR: 1, 8  Newborn Delivery   Birth date/time:  02/22/2018 05:25:00 Delivery type:  C-Section, Low Transverse Trial of labor:  No C-section categorization:  Primary     Home with mother.  Tresea Mall, CNM 02/24/2018, 10:40 AM

## 2018-02-22 NOTE — OR Nursing (Signed)
Wants to see her baby, very upset and crying.  Told her we would take her to the nursery in 15 minutes.

## 2018-02-22 NOTE — Transfer of Care (Signed)
Immediate Anesthesia Transfer of Care Note  Patient: Stacey Curtis  Procedure(s) Performed: CESAREAN SECTION (N/A )  Patient Location: PACU  Anesthesia Type:General  Level of Consciousness: sedated and patient cooperative  Airway & Oxygen Therapy: Patient Spontanous Breathing and Patient connected to face mask oxygen  Post-op Assessment: Report given to RN and Post -op Vital signs reviewed and stable  Post vital signs: Reviewed and stable  Last Vitals:  Vitals Value Taken Time  BP 161/109 02/22/2018  6:24 AM  Temp 36 C 02/22/2018  6:24 AM  Pulse 94 02/22/2018  6:31 AM  Resp 15 02/22/2018  6:31 AM  SpO2 98 % 02/22/2018  6:31 AM  Vitals shown include unvalidated device data.  Last Pain:  Vitals:   02/22/18 0226  TempSrc: Axillary  PainSc:       Patients Stated Pain Goal: 0 (02/21/18 1350)  Complications: No apparent anesthesia complications

## 2018-02-22 NOTE — Progress Notes (Signed)
   Subjective:  Painfully contracting  Objective:   Vitals: Blood pressure (!) 115/45, pulse 65, temperature 100.3 F (37.9 C), temperature source Axillary, resp. rate 18, height 5\' 5"  (1.651 m), weight 87.1 kg, last menstrual period 05/16/2017, SpO2 99 %. General: NAD Abdomen: gravid, non-tender Cervical Exam:  Dilation: Lip/rim Effacement (%): 90 Cervical Position: Anterior Station: Plus 1 Presentation: Vertex Exam by:: Bonney Aid, MD  FHT: 150-160, moderate, +accels, no decels Toco: not picking up well but about every  No results found for this or any previous visit (from the past 24 hour(s)).  Assessment:   28 y.o. G1P0000 [redacted]w[redacted]d elective IOL  Plan:   1) Labor - patient reporting "she can't do this"  Remains anterior lip.  Was rebolused in the last hour.  I feel that what she is perceiving is pressure as she states what she feel is all low vaginal.  Pitocin has been turned off.  We discussed at this point I'm unable to expedite delivery via operative vaginal delivery so the only other option would be cesarean section.  She does not want a Cesarean section.  We discussed that she may be another hour before anterior lip reduces if it reduces.  In addition we discussed that a G1 may push for up to 3-hrs.    2) Fetus - cat II tracing some subtle late although not able to pick up contractions well secondary to patient vomiting intermittently  3) Temp to 101.3, no fetal tachycardia, will monitor if repeat temp above 101.4 will start antibiotics for chorioamnionitis  [name], MD, [facility], [facility] 02/22/2018, 2:55 AM

## 2018-02-22 NOTE — Anesthesia Procedure Notes (Signed)
Procedure Name: Intubation Date/Time: 02/22/2018 5:22 AM Performed by: Waldo Laine, CRNA Pre-anesthesia Checklist: Patient identified, Patient being monitored, Timeout performed, Emergency Drugs available and Suction available Patient Re-evaluated:Patient Re-evaluated prior to induction Oxygen Delivery Method: Circle system utilized Preoxygenation: Pre-oxygenation with 100% oxygen Induction Type: IV induction, Rapid sequence and Cricoid Pressure applied Laryngoscope Size: Miller and 2 Grade View: Grade I Tube type: Oral Tube size: 6.5 mm Number of attempts: 1 Airway Equipment and Method: Stylet Placement Confirmation: ETT inserted through vocal cords under direct vision,  positive ETCO2 and breath sounds checked- equal and bilateral Secured at: 20 cm Tube secured with: Tape Dental Injury: Teeth and Oropharynx as per pre-operative assessment

## 2018-02-22 NOTE — OR Nursing (Signed)
[facility] called and said they would meet Stacey Curtis in room, instead of Stacey Curtis going to nursery.  Mother very upset that baby was not there when we arrived.  Called [facility] upon arrival to let them know we were in room.

## 2018-02-22 NOTE — Lactation Note (Signed)
This note was copied from a baby's chart. Lactation Consultation Note  Patient Name: Boy Tawni Wolper FMBBU'Y Date: 02/22/2018     Maternal Data    Feeding Feeding Type: Breast Fed  LATCH Score                   Interventions    Lactation Tools Discussed/Used     Consult Status  LC spoke with parents of baby to see how bf is going and they say things are well. Mom's nipples are sore but WDL w/o bruising, strips, etc. Mom has comfort gels and coconut oil. Patient requests a lactation visit this afternoon to observe feeding to ensure latch is well and baby is adequately draining the breasts. LC will discuss breastfeeding resources at this time.     Burnadette Peter 02/22/2018, 9:54 AM

## 2018-02-23 LAB — CBC
HCT: 25.8 % — ABNORMAL LOW (ref 36.0–46.0)
Hemoglobin: 8.6 g/dL — ABNORMAL LOW (ref 12.0–15.0)
MCH: 30.5 pg (ref 26.0–34.0)
MCHC: 33.3 g/dL (ref 30.0–36.0)
MCV: 91.5 fL (ref 80.0–100.0)
PLATELETS: 167 10*3/uL (ref 150–400)
RBC: 2.82 MIL/uL — ABNORMAL LOW (ref 3.87–5.11)
RDW: 12.6 % (ref 11.5–15.5)
WBC: 11.7 10*3/uL — ABNORMAL HIGH (ref 4.0–10.5)
nRBC: 0 % (ref 0.0–0.2)

## 2018-02-23 MED ORDER — FERROUS SULFATE 325 (65 FE) MG PO TABS
325.0000 mg | ORAL_TABLET | Freq: Two times a day (BID) | ORAL | Status: DC
  Administered 2018-02-23 – 2018-02-24 (×3): 325 mg via ORAL
  Filled 2018-02-23 (×3): qty 1

## 2018-02-23 NOTE — Lactation Note (Signed)
This note was copied from a baby's chart. Lactation Consultation Note  Patient Name: Stacey Curtis Date: 02/23/2018     Maternal Data   Feeding    LATCH Score                   Interventions    Lactation Tools Discussed/Used     Consult Status  Parents are concerned about breastfeeding baby. LC assisted parents with feeding "[name]" and spoke with them in detail about the physiology of breastmilk supply, latch, positioning, clusterfeeding, feeding cues.and skin to skin.  MOB has flat nipples and she states that they can come out. She has also been on IV fluids for approx 72hrs and edema appears to be settling in on her breasts. LC was able to use reverse pressure and her nipple was able to come out and LC used the tea cup hold for baby to stay latched and instructed the parents on how to do so.  LC left 83mm nipple shield with parents in case they need it later and reverse pressure is no longer working because MOB ended up needing more IV fluids.   [facility] tomorrow will f/u with family.    Burnadette Peter 02/23/2018, 12:35 AM

## 2018-02-23 NOTE — Plan of Care (Signed)
Vs stable; up ad lib; tolerating regular diet; taking tylenol, motrin and an occasional roxicodone; breastfeeding and has needed some assistance this shift but the feeding at 0540 the pt and her husband got baby positioned and latched independently; pt does have nipple shield; pt and RN discussed use of pacifier, the recommendation to wait about a week; pt and RN also discussed signs of "nipple confusion" and also to still look for hunger cues if using the pacifier; pt and her husband brought their own pacifier with them and used it twice in this 12 hour shift

## 2018-02-23 NOTE — Anesthesia Postprocedure Evaluation (Signed)
Anesthesia Post Note  Patient: Stacey Curtis  Procedure(s) Performed: CESAREAN SECTION (N/A )  Patient location during evaluation: Mother Baby Anesthesia Type: Epidural and General Level of consciousness: oriented and awake and alert Pain management: pain level controlled Vital Signs Assessment: post-procedure vital signs reviewed and stable Respiratory status: spontaneous breathing and respiratory function stable Cardiovascular status: blood pressure returned to baseline and stable Postop Assessment: no headache, no backache, no apparent nausea or vomiting and able to ambulate Anesthetic complications: no Comments: Epidural converted to general     Last Vitals:  Vitals:   02/22/18 2145 02/23/18 0032  BP: 114/82 119/76  Pulse: 88 76  Resp: 20 16  Temp: 36.9 C 36.9 C  SpO2: 98% 97%    Last Pain:  Vitals:   02/23/18 0550  TempSrc:   PainSc: 2                  Starling Manns

## 2018-02-23 NOTE — Progress Notes (Signed)
POD #1 LTCS for arrest of labor Subjective:  Feeling a little unsteady when she is up to the BR. Voiding without difficulty. Tolerating regular diet, but has only had one regular meal. Pain controlled with ON Q, roxicodone, Tylenol and motrin. Breast feeding   Objective:  Blood pressure 119/76, pulse 76, temperature 98.4 F (36.9 C), temperature source Oral, resp. rate 16, height 5\' 5"  (1.651 m), weight 87.1 kg, last menstrual period 05/16/2017, SpO2 97 %.  General: WF in NAD Pulmonary: no increased work of breathing/ CTA Heart: RRR without murmur Abdomen: soft, non-distended, non-tender, fundus firm at level of umbilicus Incision: Honey comb dressing intact, old dried blood smudges present, no active bleeding. ON Q intact Extremities: +1 pedal edema, no erythema, no tenderness  Results for orders placed or performed during the hospital encounter of 02/20/18 (from the past 72 hour(s))  CBC     Status: Abnormal   Collection Time: 02/23/18  4:51 AM  Result Value Ref Range   WBC 11.7 (H) 4.0 - 10.5 K/uL   RBC 2.82 (L) 3.87 - 5.11 MIL/uL   Hemoglobin 8.6 (L) 12.0 - 15.0 g/dL   HCT 15.8 (L) 30.9 - 40.7 %   MCV 91.5 80.0 - 100.0 fL   MCH 30.5 26.0 - 34.0 pg   MCHC 33.3 30.0 - 36.0 g/dL   RDW 68.0 88.1 - 10.3 %   Platelets 167 150 - 400 K/uL   nRBC 0.0 0.0 - 0.2 %    Comment: Performed at [facility], [address], [place], [place] [address]     Assessment:   28 y.o. G1P0000 postoperativeday # 1-stable  Ambulate with assistance  Regular diet  Continue postopeative and postpartum care  Support breast feeding   Plan:  1) Acute blood loss anemia - hemodynamically stable. Feeling a bit unsteady on feet, but "doesn't feel like I am going to pass out". Encouraged to eat before getting out of bed, to have assistance getting OOB or when showering, and to have a chair in the shower to sit on. DIscussed blood transfusion for anemia if symptomatic and she declines. Will start  iron and vitamin supplementation   2) A pos/ RI/ VI  3) TDAP UTD   4) Breast/ Contraception-condoms  5) Disposition-possible discharge on POD 2 or 3  Farrel Conners, [place]

## 2018-02-23 NOTE — Lactation Note (Signed)
This note was copied from a baby's chart. Lactation Consultation Note  Patient Name: Stacey Curtis LTRVU'Y Date: 02/23/2018 Reason for consult: Initial assessment   Maternal Data    Feeding Feeding Type: Breast Fed  LATCH Score Latch: Grasps breast easily, tongue down, lips flanged, rhythmical sucking.  Audible Swallowing: A few with stimulation  Type of Nipple: Flat  Comfort (Breast/Nipple): Soft / non-tender  Hold (Positioning): Assistance needed to correctly position infant at breast and maintain latch.  LATCH Score: 7  Interventions Interventions: Assisted with latch;Breast compression;Adjust position;Support pillows  Lactation Tools Discussed/Used Tools: Nipple Shields Nipple shield size: 16   Consult Status Consult Status: Follow-up Date: 02/23/18 Follow-up type: In-patient Mother states that she has been mostly feeding on the left breast because baby Stacey Curtis cannot latch to the right breast and her left nipple is getting sore. Mother's nipples are flat, areola's are swollen and she is currently using the nipple shield. LC explained that is it important to try and get Stacey Curtis to feed on the right breast so milk is distributed evenly. LC assisted with positioning Stacey Curtis on the right breast using the shield in the football hold. Stacey Curtis was able to latch and LC instructed dad how to do breast compressions for stimulation of colostrum. Stacey Curtis breast-fed for about 10 minutes with audible swallows heard. Parents feel more confident using the football hold on the right side.   Arlyss Gandy 02/23/2018, 1:22 PM

## 2018-02-24 DIAGNOSIS — Z98891 History of uterine scar from previous surgery: Secondary | ICD-10-CM

## 2018-02-24 MED ORDER — MAGNESIUM HYDROXIDE 400 MG/5ML PO SUSP
30.0000 mL | Freq: Every day | ORAL | Status: DC | PRN
  Administered 2018-02-24: 30 mL via ORAL
  Filled 2018-02-24: qty 30

## 2018-02-24 MED ORDER — OXYCODONE HCL 5 MG PO TABS: 5.0000 mg | ORAL_TABLET | Freq: Four times a day (QID) | ORAL | 0 refills | Status: AC | PRN

## 2018-02-24 MED ORDER — BISACODYL 10 MG RE SUPP: 10.0000 mg | Freq: Every day | RECTAL | Status: DC | PRN

## 2018-02-24 NOTE — Discharge Instructions (Signed)
Please call your doctor or return to the ER if you experience any chest pains, shortness of breath, dizziness, visual changes, fever greater than 101, any heavy bleeding (saturating more than 1 pad per hour), large clots, or foul smelling discharge, any worsening abdominal pain and cramping that is not controlled by pain medication, or any signs of postpartum depression. No tampons, enemas, douches, or sexual intercourse for 6 weeks. Also avoid tub baths, hot tubs, or swimming for 6 weeks.  ° ° °Check your incision daily for any signs of infection such as redness, warmth, swelling, increased pain, or pus/foul smelling drainage ° ° °Activity: do not lift over 10 lbs for 6 weeks  °No driving for 2 weeks  °Pelvic rest for 6 weeks  °

## 2018-02-24 NOTE — Progress Notes (Signed)
Discharge order received from doctor. On-Q pump removed at discharge per patient request. Reviewed discharge instructions and prescriptions with patient and answered all questions. Incision cleaning kit given. Follow up appointment given. Patient verbalized understanding. ID bands checked. Patient discharged home with infant via wheelchair by nursing/auxillary.    Hilbert Bible, RN

## 2018-02-24 NOTE — Plan of Care (Signed)
Vs stable; up ad lib; tolerating regular diet; encouraged to ambulate more in hallway; breastfeeding and doing well; uses nipple shield while breastfeeding; biggest complaint this shift was constipation/"bloating"; pt has sennakot scheduled, milk of magnesia PRN and dulcolax suppository PRN; pt has asked about going home today

## 2018-02-24 NOTE — Lactation Note (Signed)
This note was copied from a baby's chart. Lactation Consultation Note  Patient Name: Stacey Curtis VCBSW'H Date: 02/24/2018   Ready for d/c, just breastfed baby after circ.  Feeding went well mom reports, she continues to use nipple shield with feedings, discussed weaning options from shield, use pump before latch, everting nipple before feeding, beginning to have more fullness and heaviness in breasts, she has a Spectra breast pump and a [place], offered consult to assist with weaning from shield, she has LC office phone no., mom prefers to continue with shield for now until milk increases, is sl. Anxious without shield, has friends who have breastfed and used Spectra pump.    Maternal Data    Feeding Feeding Type: Breast Fed  LATCH Score                   Interventions    Lactation Tools Discussed/Used     Consult Status      Dyann Kief 02/24/2018, 12:28 PM

## 2018-02-28 ENCOUNTER — Ambulatory Visit (INDEPENDENT_AMBULATORY_CARE_PROVIDER_SITE_OTHER): Payer: 59 | Admitting: Obstetrics and Gynecology

## 2018-02-28 VITALS — BP 124/78 | HR 101 | Wt 182.0 lb

## 2018-02-28 DIAGNOSIS — Z4889 Encounter for other specified surgical aftercare: Secondary | ICD-10-CM

## 2018-02-28 NOTE — Progress Notes (Signed)
      Postoperative Follow-up Patient presents post op from 1LTCS 1weeks ago for fetal intolerance to labor.  Subjective: Patient reports some improvement in her preop symptoms. Eating a regular diet without difficulty. Pain is controlled with current analgesics. Medications being used: percocet, ibuprofen.  Activity: normal activities of daily living.  Objective: Blood pressure 124/78, pulse (!) 101, weight 182 lb (82.6 kg), currently breastfeeding.  General: NAD Pulmonary: no increased work of breathing Abdomen: soft, non-tender, non-distended, incision D/C/I Extremities: no edema Neurologic: normal gait    Admission on 02/20/2018, Discharged on 02/24/2018  Component Date Value Ref Range Status  . WBC 02/20/2018 8.7  4.0 - 10.5 K/uL Final  . RBC 02/20/2018 4.01  3.87 - 5.11 MIL/uL Final  . Hemoglobin 02/20/2018 12.1  12.0 - 15.0 g/dL Final  . HCT 77/11/6577 35.6* 36.0 - 46.0 % Final  . MCV 02/20/2018 88.8  80.0 - 100.0 fL Final  . MCH 02/20/2018 30.2  26.0 - 34.0 pg Final  . MCHC 02/20/2018 34.0  30.0 - 36.0 g/dL Final  . RDW 03/83/3383 12.2  11.5 - 15.5 % Final  . Platelets 02/20/2018 229  150 - 400 K/uL Final  . nRBC 02/20/2018 0.0  0.0 - 0.2 % Final   Performed at [facility], [address], [place], [place] [address]  . RPR Ser Ql 02/20/2018 Non Reactive  Non Reactive Final   Comment: (NOTE) Performed At: [facility] [address] [place], [place] [id number] Jolene Schimke MD [id number]   . ABO/RH(D) 02/20/2018 A POS   Final  . Antibody Screen 02/20/2018 NEG   Final  . Sample Expiration 02/20/2018    Final                   Value:02/23/2018 Performed at [facility], [address], [place], [place] [address]   . Gonorrhea 02/03/2018 Negative   Final  . Varicella 07/07/2017 Immune   Final  . WBC 02/23/2018 11.7* 4.0 - 10.5 K/uL Final  . RBC 02/23/2018 2.82* 3.87 - 5.11 MIL/uL Final  . Hemoglobin 02/23/2018 8.6* 12.0 - 15.0  g/dL Final  . HCT 35/68/6168 25.8* 36.0 - 46.0 % Final  . MCV 02/23/2018 91.5  80.0 - 100.0 fL Final  . MCH 02/23/2018 30.5  26.0 - 34.0 pg Final  . MCHC 02/23/2018 33.3  30.0 - 36.0 g/dL Final  . RDW 37/29/0211 12.6  11.5 - 15.5 % Final  . Platelets 02/23/2018 167  150 - 400 K/uL Final  . nRBC 02/23/2018 0.0  0.0 - 0.2 % Final   Performed at [facility], [address] [place], [place] [address]    Assessment: 28 y.o. s/p 1LTCS stable  Plan: Patient has done well after surgery with no apparent complications.  I have discussed the post-operative course to date, and the expected progress moving forward.  The patient understands what complications to be concerned about.  I will see the patient in routine follow up, or sooner if needed.    Activity plan: No heavy lifting.   [name], MD, [facility], [facility] 02/28/2018, 11:06 AM

## 2018-03-01 ENCOUNTER — Ambulatory Visit: Payer: 59 | Admitting: Obstetrics and Gynecology

## 2018-03-09 ENCOUNTER — Telehealth: Payer: Self-pay

## 2018-03-09 NOTE — Telephone Encounter (Signed)
Advise

## 2018-03-09 NOTE — Telephone Encounter (Signed)
Pt had c/s on the 29th; everything has been fine until yesterday; has horrible pain on one side of incision; can't put any wt on right leg - it hurts really bad; 8/10 on pain scale; barrels over.  [phone]

## 2018-03-09 NOTE — Telephone Encounter (Signed)
If she has that significant pain she needs to go to the ER to make sure she doesn't have something like a pelvic thrombophlebitis.  Spoke with patient she states symptoms have resolved

## 2018-03-09 NOTE — Telephone Encounter (Signed)
Pt forgot to ask earlier - has jury duty summons for March.  Can she have a note to excuse her since postpartum?  [phone]

## 2018-03-09 NOTE — Telephone Encounter (Signed)
Please advise 

## 2018-03-14 ENCOUNTER — Encounter: Payer: Self-pay | Admitting: Obstetrics and Gynecology

## 2018-03-14 NOTE — Telephone Encounter (Signed)
Pt aware to pick up from office

## 2018-03-14 NOTE — Telephone Encounter (Signed)
She can pick it up 

## 2018-03-24 ENCOUNTER — Encounter: Payer: Self-pay | Admitting: Obstetrics and Gynecology

## 2018-03-24 ENCOUNTER — Ambulatory Visit (INDEPENDENT_AMBULATORY_CARE_PROVIDER_SITE_OTHER): Payer: 59 | Admitting: Obstetrics and Gynecology

## 2018-03-24 VITALS — BP 126/74 | HR 74 | Ht 65.0 in | Wt 165.0 lb

## 2018-03-24 DIAGNOSIS — K602 Anal fissure, unspecified: Secondary | ICD-10-CM

## 2018-03-24 MED ORDER — NITROGLYCERIN 0.4 % RE OINT: TOPICAL_OINTMENT | RECTAL | 0 refills | Status: DC

## 2018-03-24 NOTE — Progress Notes (Signed)
Obstetrics & Gynecology Office Visit   Chief Complaint:  Chief Complaint  Patient presents with  . Postpartum Care    Hemorrhoids    History of Present Illness: 28 year old s/p 1LTCS on 02/22/2018 presenting with hematochezia and painful bowl movement.  Denies constipation currently although she did have some constipation postoperatively.  Currently not any narcotics.  Regular daily bowl movements, normal consistency.  She reports the amount of bleeding as moderate.  It does stain the toilet bowl not just when wiping.  Did have issues with hemorrhoids in the pregnancy.     Review of Systems: Review of Systems  Constitutional: Negative.   Gastrointestinal: Negative.   Genitourinary: Negative.      Past Medical History:  Past Medical History:  Diagnosis Date  . Anxiety and depression   . Common migraine   . Nephrolithiasis   . 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 Recurrent cystitis     Past Surgical History:  Past Surgical History:  Procedure Laterality Date  . CESAREAN SECTION N/A 02/22/2018   Procedure: CESAREAN SECTION;  Surgeon: [name], MD;  Location: [facility] ORS;  Service: Obstetrics;  Laterality: N/A;  . TONSILLECTOMY  2011    Gynecologic History: No LMP recorded.  Obstetric History: G1P0000  Family History:  Family History  Problem Relation Age of Onset  . Cardiomyopathy Father   . Cardiomyopathy Paternal Grandfather   . Melanoma Paternal Grandmother   . Cerebral palsy Cousin   . Diabetes Neg Hx   . Hypertension Neg Hx   . Stroke Neg Hx   . Thyroid disease Neg Hx     Social History:  Social History   Socioeconomic History  . Marital status: Married    Spouse name: Not on file  . Number of children: 0  . Years of education: Not on file  . Highest education level: Not on file  Occupational History  . Occupation: Gaffer    Comment: [employer]  . Occupation: Education officer, community  Social Needs  . Financial resource strain: Not on file  . Food insecurity:    Worry: Not on file    Inability: Not on file  . Transportation needs:    Medical: Not on file    Non-medical: Not on file  Tobacco Use  . Smoking status: Never Smoker  . Smokeless tobacco: Never Used  Substance and Sexual Activity  . Alcohol use: Yes    Comment: occasional  . Drug use: No  . Sexual activity: Yes    Partners: Male    Birth control/protection: None  Lifestyle  . Physical activity:    Days per week: 2 days    Minutes per session: 30 min  . Stress: Rather much  Relationships  . Social connections:    Talks on phone: More than three times a week    Gets together: Twice a week    Attends religious service: Never    Active member of club or organization: Yes    Attends meetings of clubs or organizations: More than 4 times per year    Relationship status: Married  . Intimate partner violence:    Fear of current or ex partner: No    Emotionally abused: No    Physically abused: No    Forced sexual activity: No  Other Topics Concern  . Not on file  Social History Narrative   Starting at [employer] for  [employer] (fall 2015)                Allergies:  Allergies  Allergen Reactions  . Cephalexin Anaphylaxis  . Sulfa Antibiotics Anaphylaxis    Medications: Prior to Admission medications   Medication Sig Start Date End Date Taking? Authorizing Provider  Nitroglycerin 0.4 % OINT Apply 1 inch intra-anally every 12-hrs for 3 weeks 03/24/18   [name], MD    Physical Exam Vitals:  Vitals:   03/24/18 1514  BP: 126/74  Pulse: 74   No LMP recorded.  General: NAD HEENT: normocephalic, anicteric Pulmonary: No increased work of breathing GU:  Rectal: No visible external hemorrhoids noted  Lymphatic: no evidence of inguinal lymphadenopathy Extremities: no edema, erythema, or tenderness Neurologic: Grossly intact Psychiatric: mood appropriate, affect  full  Female chaperone present for pelvic  portions of the physical exam  Assessment: 28 y.o. G1P1001 with anal fissure  Plan: Problem List Items Addressed This Visit    None    Visit Diagnoses    Anal fissure    -  Primary     1) Anal fissure - given no evidence of external hemerrhoids as well as pain with bowl movement along with bleeding suspect anal fissure.   Topical nitroglycerine written.  If no improvement by the time of 6 week postpartum referral to GI. - ensure regular bowl movement prn colace and fiber supplements - sitz baths prn  2) A total of 15 minutes were spent in face-to-face contact with the patient during this encounter with over half of that time devoted to counseling and coordination of care.  3) Return keep 6 week postpartum visit.    [name], MD, [facility], [facility]

## 2018-03-27 ENCOUNTER — Telehealth: Payer: Self-pay

## 2018-03-27 NOTE — Telephone Encounter (Signed)
PerpartionH would be something she can try or the desitin cream

## 2018-03-27 NOTE — Telephone Encounter (Signed)
Pt aware via voicemail 

## 2018-03-27 NOTE — Telephone Encounter (Signed)
The medication rx'd by AMS for fissure has no generic and is going to be over $600.  Is there another rx or OTC medication she can use instead?  [phone]

## 2018-03-27 NOTE — Telephone Encounter (Signed)
advise

## 2018-03-30 DIAGNOSIS — Z3482 Encounter for supervision of other normal pregnancy, second trimester: Secondary | ICD-10-CM | POA: Diagnosis not present

## 2018-03-30 DIAGNOSIS — Z3483 Encounter for supervision of other normal pregnancy, third trimester: Secondary | ICD-10-CM | POA: Diagnosis not present

## 2018-04-06 ENCOUNTER — Encounter: Payer: Self-pay | Admitting: Obstetrics and Gynecology

## 2018-04-06 ENCOUNTER — Ambulatory Visit (INDEPENDENT_AMBULATORY_CARE_PROVIDER_SITE_OTHER): Payer: 59 | Admitting: Obstetrics and Gynecology

## 2018-04-06 ENCOUNTER — Other Ambulatory Visit: Payer: Self-pay

## 2018-04-06 VITALS — BP 100/60 | HR 91 | Ht 65.0 in | Wt 161.0 lb

## 2018-04-06 DIAGNOSIS — F53 Postpartum depression: Secondary | ICD-10-CM

## 2018-04-06 DIAGNOSIS — O99345 Other mental disorders complicating the puerperium: Secondary | ICD-10-CM

## 2018-04-06 MED ORDER — ESCITALOPRAM OXALATE 10 MG PO TABS: 10.0000 mg | ORAL_TABLET | Freq: Every day | ORAL | 2 refills | Status: DC

## 2018-04-06 NOTE — Progress Notes (Signed)
Postpartum Visit  Chief Complaint:  Chief Complaint  Patient presents with  . Postpartum Care    History of Present Illness: Patient is a 28 y.o. G1P1001 presents for postpartum visit.  Date of delivery: 02/22/2018 Cesarean Section: active phase arrest Pregnancy or labor problems:  no Any problems since the delivery:  Yes, rectal fissure  Newborn Details:  SINGLETON :  1. BabyGender female. Birth weight: 8lbs 8oz Maternal Details:  Breast or formula feeding: plans to breastfeed Intercourse: No  Contraception after delivery: No  Any bowel or bladder issues: No Post partum depression/anxiety noted:  yes Edinburgh Post-Partum Depression Score:11 Date of last PAP: 05/05/2017  no abnormalities   Review of Systems: Review of Systems  Constitutional: Negative.   Gastrointestinal: Negative.   Genitourinary: Negative.   Psychiatric/Behavioral: Positive for depression. Negative for hallucinations, memory loss, substance abuse and suicidal ideas. The patient is nervous/anxious and has insomnia.     The following portions of the patient's history were reviewed and updated as appropriate: allergies, current medications, past family history, past medical history, past social history, past surgical history and problem list.  Past Medical History:  Past Medical History:  Diagnosis Date  . Anxiety and depression   . Common migraine   . Nephrolithiasis   . 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 Recurrent cystitis     Past Surgical History:  Past Surgical History:  Procedure Laterality Date  . CESAREAN SECTION N/A 02/22/2018   Procedure: CESAREAN SECTION;  Surgeon: [name], MD;  Location: [facility] ORS;  Service: Obstetrics;  Laterality: N/A;  . TONSILLECTOMY  2011    Family History:  Family History  Problem Relation Age of Onset  . Cardiomyopathy Father   . Cardiomyopathy Paternal Grandfather   . Melanoma Paternal  Grandmother   . Cerebral palsy Cousin   . Diabetes Neg Hx   . Hypertension Neg Hx   . Stroke Neg Hx   . Thyroid disease Neg Hx     Social History:  Social History   Socioeconomic History  . Marital status: Married    Spouse name: Not on file  . Number of children: 0  . Years of education: Not on file  . Highest education level: Not on file  Occupational History  . Occupation: Gaffer    Comment: [employer]  . Occupation: Passenger transport manager  Social Needs  . Financial resource strain: Not on file  . Food insecurity:    Worry: Not on file    Inability: Not on file  . Transportation needs:    Medical: Not on file    Non-medical: Not on file  Tobacco Use  . Smoking status: Never Smoker  . Smokeless tobacco: Never Used  Substance and Sexual Activity  . Alcohol use: Yes    Comment: occasional  . Drug use: No  . Sexual activity: Not Currently    Partners: Male    Birth control/protection: None  Lifestyle  . Physical activity:    Days per week: 2 days    Minutes per session: 30 min  . Stress: Rather much  Relationships  . Social connections:    Talks on phone: More than three times a week    Gets together: Twice a week    Attends religious service: Never    Active member of club or organization: Yes    Attends meetings of clubs or organizations: More than 4 times per  year    Relationship status: Married  . Intimate partner violence:    Fear of current or ex partner: No    Emotionally abused: No    Physically abused: No    Forced sexual activity: No  Other Topics Concern  . Not on file  Social History Narrative   Starting at [employer] for [employer] (fall 2015)                Allergies:  Allergies  Allergen Reactions  . Cephalexin Anaphylaxis  . Sulfa Antibiotics Anaphylaxis    Medications: Prior to Admission medications   Medication Sig Start Date End Date Taking? Authorizing Provider  Nitroglycerin 0.4 % OINT Apply 1 inch  intra-anally every 12-hrs for 3 weeks Patient not taking: Reported on 04/06/2018 03/24/18   [name], MD    Physical Exam Blood pressure 100/60, pulse 91, height 5\' 5"  (1.651 m), weight 161 lb (73 kg), currently breastfeeding.  General: NAD HEENT: normocephalic, anicteric Pulmonary: No increased work of breathing Abdomen: NABS, soft, non-tender, non-distended.  Umbilicus without lesions.  No hepatomegaly, splenomegaly or masses palpable. No evidence of hernia. Incision D/C/I Genitourinary:  External: Normal external female genitalia.  Normal urethral meatus, normal  Bartholin's and Skene's glands.    Vagina: Normal vaginal mucosa, no evidence of prolapse.    Cervix: Grossly normal in appearance, no bleeding  Uterus: Non-enlarged, mobile, normal contour.  No CMT  Adnexa: ovaries non-enlarged, no adnexal masses  Rectal: deferred Extremities: no edema, erythema, or tenderness Neurologic: Grossly intact Psychiatric: mood appropriate, affect full    Assessment: 28 y.o. G1P1001 presenting for 6 week postpartum visit  Plan: Problem List Items Addressed This Visit    None    Visit Diagnoses    Postpartum depression    -  Primary   Relevant Medications   escitalopram (LEXAPRO) 10 MG tablet   6 weeks postpartum follow-up          1) Contraception - Education given regarding options for contraception, as well as compatibility with breast feeding if applicable.  Patient plans on condoms for contraception.  2)  Pap - ASCCP guidelines and rational discussed.  ASCCP guidelines and rational discussed.  Patient opts for every 3 years screening interval  3) Patient underwent screening for postpartum depression with signs and symptoms of depression.  Start Lexapro 10mg   4) Return in about 2 weeks (around 04/20/2018) for medication follow up.   [name], MD, [facility], [facility] 04/06/2018, 2:16 PM

## 2018-04-21 ENCOUNTER — Ambulatory Visit (INDEPENDENT_AMBULATORY_CARE_PROVIDER_SITE_OTHER): Payer: 59 | Admitting: Obstetrics and Gynecology

## 2018-04-21 ENCOUNTER — Other Ambulatory Visit: Payer: Self-pay

## 2018-04-21 DIAGNOSIS — F419 Anxiety disorder, unspecified: Secondary | ICD-10-CM

## 2018-04-21 DIAGNOSIS — G4709 Other insomnia: Secondary | ICD-10-CM | POA: Diagnosis not present

## 2018-04-21 DIAGNOSIS — O99345 Other mental disorders complicating the puerperium: Secondary | ICD-10-CM

## 2018-04-21 DIAGNOSIS — F53 Postpartum depression: Secondary | ICD-10-CM

## 2018-04-21 MED ORDER — ESCITALOPRAM OXALATE 20 MG PO TABS: 20.0000 mg | ORAL_TABLET | Freq: Every day | ORAL | 2 refills | Status: DC

## 2018-04-21 NOTE — Progress Notes (Signed)
I connected with@ on 04/21/18 at  1:30 PM EDT by telephone and verified that I am speaking with the correct person using two identifiers.   I discussed the limitations, risks, security and privacy concerns of performing an evaluation and management service by telephone and the availability of in person appointments. I also discussed with the patient that there may be a patient responsible charge related to this service. The patient expressed understanding and agreed to proceed.  The patient was at home I spoke with the patient from my workstation phone The names of people involved in this encounter were: Stacey Curtis , and [name], MD   Obstetrics & Gynecology Office Visit   Chief Complaint: No chief complaint on file.   History of Present Illness: The patient is a 28 y.o. female presenting follow up for symptoms of anxiety and depression.  The patient is currently taking lexapro 10mg  for the management of her symptoms.  She has had recent situational stressors (newborn, COVID-19).  She reports symptoms of anhedonia, insomnia, irritability, social anxiety, feelings of guilt, auditory hallucinations and visual hallucinations.  She denies agorophobia, suicidal ideation, homicidal ideation, auditory hallucinations and visual hallucinations. Symptoms have improved since last visit.     The patient does not have a pre-existing history of depression and anxiety.  She  does not a prior history of suicide attempts.   Initially noted a fair bit of nausea with starting Lexapro this subsided as she has continued taking it.    Review of Systems: Review of Systems  Constitutional: Negative.   Gastrointestinal: Negative for nausea.  Neurological: Negative for headaches.  Psychiatric/Behavioral: Positive for depression. Negative for hallucinations, memory loss, substance abuse and suicidal ideas. The patient is nervous/anxious and has insomnia.      Past Medical History:  Past  Medical History:  Diagnosis Date  . Anxiety and depression   . Common migraine   . Nephrolithiasis   . 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 Recurrent cystitis     Past Surgical History:  Past Surgical History:  Procedure Laterality Date  . CESAREAN SECTION N/A 02/22/2018   Procedure: CESAREAN SECTION;  Surgeon: [name], MD;  Location: [facility] ORS;  Service: Obstetrics;  Laterality: N/A;  . TONSILLECTOMY  2011    Gynecologic History: No LMP recorded.  Obstetric History: G1P1001  Family History:  Family History  Problem Relation Age of Onset  . Cardiomyopathy Father   . Cardiomyopathy Paternal Grandfather   . Melanoma Paternal Grandmother   . Cerebral palsy Cousin   . Diabetes Neg Hx   . Hypertension Neg Hx   . Stroke Neg Hx   . Thyroid disease Neg Hx     Social History:  Social History   Socioeconomic History  . Marital status: Married    Spouse name: Not on file  . Number of children: 0  . Years of education: Not on file  . Highest education level: Not on file  Occupational History  . Occupation: Gaffer    Comment: [employer]  . Occupation: Passenger transport manager  Social Needs  . Financial resource strain: Not on file  . Food insecurity:    Worry: Not on file    Inability: Not on file  . Transportation needs:    Medical: Not on file    Non-medical: Not on file  Tobacco Use  . Smoking status: Never Smoker  . Smokeless tobacco: Never Used  Substance and Sexual Activity  . Alcohol use: Yes    Comment: occasional  . Drug use: No  . Sexual activity: Not Currently    Partners: Male    Birth control/protection: None  Lifestyle  . Physical activity:    Days per week: 2 days    Minutes per session: 30 min  . Stress: Rather much  Relationships  . Social connections:    Talks on phone: More than three times a week    Gets together: Twice a week    Attends religious service: Never     Active member of club or organization: Yes    Attends meetings of clubs or organizations: More than 4 times per year    Relationship status: Married  . Intimate partner violence:    Fear of current or ex partner: No    Emotionally abused: No    Physically abused: No    Forced sexual activity: No  Other Topics Concern  . Not on file  Social History Narrative   Starting at [employer] for [employer] (fall 2015)                Allergies:  Allergies  Allergen Reactions  . Cephalexin Anaphylaxis  . Sulfa Antibiotics Anaphylaxis    Medications: Prior to Admission medications   Medication Sig Start Date End Date Taking? Authorizing Provider  escitalopram (LEXAPRO) 10 MG tablet Take 1 tablet (10 mg total) by mouth daily. 04/06/18 04/06/19  [name], MD  Nitroglycerin 0.4 % OINT Apply 1 inch intra-anally every 12-hrs for 3 weeks Patient not taking: Reported on 04/06/2018 03/24/18   [name], MD    Physical Exam Vitals: There were no vitals filed for this visit. No LMP recorded.  Physical exam was not conducted as this was a Telephone interview in order to promote social distancing during current COVID-19 Pandemic   Edinburgh Postnatal Depression Scale - 04/21/18 1350      Edinburgh Postnatal Depression Scale:  In the Past 7 Days   I have been able to laugh and see the funny side of things.  1    I have looked forward with enjoyment to things.  1    I have blamed myself unnecessarily when things went wrong.  1    I have been anxious or worried for no good reason.  3    I have felt scared or panicky for no good reason.  2    Things have been getting on top of me.  2    I have been so unhappy that I have had difficulty sleeping.  2    I have felt sad or miserable.  1    I have been so unhappy that I have been crying.  1    The thought of harming myself has occurred to me.  0    Edinburgh Postnatal Depression Scale Total  14      Edinburgh Postnatal Depression  Scale - 04/21/18 1350      Edinburgh Postnatal Depression Scale:  In the Past 7 Days   I have been able to laugh and see the funny side of things.  1    I have looked forward with enjoyment to things.  1    I have blamed myself unnecessarily when things went wrong.  1    I have been anxious or worried for no good reason.  3    I have felt scared or panicky for no good reason.  2  Things have been getting on top of me.  2    I have been so unhappy that I have had difficulty sleeping.  2    I have felt sad or miserable.  1    I have been so unhappy that I have been crying.  1    The thought of harming myself has occurred to me.  0    Edinburgh Postnatal Depression Scale Total  14        Assessment: 28 y.o. G1P1001 postpartum depression follow up  Plan: Problem List Items Addressed This Visit    None    Visit Diagnoses    Postpartum depression    -  Primary   Relevant Medications   escitalopram (LEXAPRO) 20 MG tablet      1) Postpartum depression follow up - EPDS of 14.  Overall the patient reports she is doing better than she was at the time of her last visit although her prior score on 04/06/2018 was 11.  She understandably is a little anxious about the current COVID-19 situation.   - Will increase lexapro to  po daily  2) Thyroid and B12 screen has not been obtained previously  3) A total of  13 minutes 12 seconds were spent on the phone with the patient, with 100% of that time devoted to counseling and coordination of care.  4) Return in about 4 weeks (around 05/19/2018) for medication follow up (telephone).   [name], MD, [facility], [facility] 04/21/2018, 1:51 PM

## 2018-05-02 DIAGNOSIS — Z3483 Encounter for supervision of other normal pregnancy, third trimester: Secondary | ICD-10-CM | POA: Diagnosis not present

## 2018-05-02 DIAGNOSIS — Z3482 Encounter for supervision of other normal pregnancy, second trimester: Secondary | ICD-10-CM | POA: Diagnosis not present

## 2018-06-07 DIAGNOSIS — Z3482 Encounter for supervision of other normal pregnancy, second trimester: Secondary | ICD-10-CM | POA: Diagnosis not present

## 2018-06-07 DIAGNOSIS — Z3483 Encounter for supervision of other normal pregnancy, third trimester: Secondary | ICD-10-CM | POA: Diagnosis not present

## 2018-07-15 ENCOUNTER — Other Ambulatory Visit: Payer: Self-pay | Admitting: Obstetrics and Gynecology

## 2018-10-18 ENCOUNTER — Other Ambulatory Visit: Payer: Self-pay | Admitting: Obstetrics and Gynecology

## 2018-11-16 ENCOUNTER — Other Ambulatory Visit: Payer: Self-pay | Admitting: Obstetrics & Gynecology

## 2018-11-16 ENCOUNTER — Telehealth: Payer: Self-pay

## 2018-11-16 MED ORDER — ZOLPIDEM TARTRATE 5 MG PO TABS: 5.0000 mg | ORAL_TABLET | Freq: Every evening | ORAL | 0 refills | Status: DC | PRN

## 2018-11-16 NOTE — Telephone Encounter (Signed)
Let her know to give Ambien a trial, for short term use only.  No significant passage into breast milk. Schedule follow up with [facility]MS if needed for longer term management ERx done

## 2018-11-16 NOTE — Telephone Encounter (Signed)
Spoke w/patient. Inquired when the Pharmacy switch was made. She stated a few months ago. Apologized for the mix up. Notified last Lexapro rx sent in on 10/18/2018 for 1 mo w/2 refills. Advised since [facility], she can request for it to be filled at her new, local pharmacy [facility] than [facility]. She reports she has tried several "natural" remedies for sleep that she felt safe with while breast feeding (hot tea, lavender, chamomile, oil in diffuser). She has reached a point of exhaustion that is affecting her in many areas of her daily life. Requests review from another provider as AMS out of office today.

## 2018-11-16 NOTE — Telephone Encounter (Signed)
Patient states she updated address and pharmacy with front desk and on My Chart. She states her rx's are still being sent to [place]. She is having some problems with Lexapro in general. She's not sleeping well. Her son is 9 months and finally sleeping thru the night, but she isn't sleeping well. She is up 4-5 times every night. Inquiring if there is something she can take to help her sleep that is safe with breast feeding. [id number]#[phone]

## 2018-11-16 NOTE — Telephone Encounter (Signed)
Pt aware.

## 2018-11-27 ENCOUNTER — Telehealth: Payer: Self-pay

## 2018-11-27 ENCOUNTER — Encounter: Payer: Self-pay | Admitting: Maternal Newborn

## 2018-11-27 ENCOUNTER — Ambulatory Visit (INDEPENDENT_AMBULATORY_CARE_PROVIDER_SITE_OTHER): Payer: 59 | Admitting: Maternal Newborn

## 2018-11-27 ENCOUNTER — Other Ambulatory Visit: Payer: Self-pay

## 2018-11-27 VITALS — BP 122/78 | Ht 65.0 in | Wt 186.0 lb

## 2018-11-27 DIAGNOSIS — Z3201 Encounter for pregnancy test, result positive: Secondary | ICD-10-CM

## 2018-11-27 DIAGNOSIS — O26899 Other specified pregnancy related conditions, unspecified trimester: Secondary | ICD-10-CM

## 2018-11-27 DIAGNOSIS — R109 Unspecified abdominal pain: Secondary | ICD-10-CM

## 2018-11-27 DIAGNOSIS — Z369 Encounter for antenatal screening, unspecified: Secondary | ICD-10-CM

## 2018-11-27 NOTE — Progress Notes (Signed)
Obstetrics & Gynecology Office Visit   Chief Complaint:  Chief Complaint  Patient presents with  . Pelvic Pain    pt had + UPT, now cramping a lot     History of Present Illness: Stacey Curtis has had two positive home pregnancy tests. Her LMP was 10/15/2018. She has been breastfeeding her 82 month old and has been having regular menses. She started to have some cramping yesterday that has become more intense since it began. It feels like a burning sensation. It is located in her right lower quadrant. She had one episode of pink spotting when wiping last week, otherwise no bleeding or spotting. She has recently stopped taking Lexapro with the discovery of this pregnancy, but states that she feels well and that her anxiety has not worsened.  Review of Systems: Review of systems negative unless otherwise noted in HPI.  Past Medical History:  Past Medical History:  Diagnosis Date  . Anxiety and depression   . Common migraine   . Nephrolithiasis   . PATELLO-FEMORAL SYNDROME 08/16/2008   Qualifier: Diagnosis of  By: Lorelei Pont MD, Frederico Hamman    . Recurrent cystitis     Past Surgical History:  Past Surgical History:  Procedure Laterality Date  . CESAREAN SECTION N/A 02/22/2018   Procedure: CESAREAN SECTION;  Surgeon: Malachy Mood, MD;  Location: [facility] ORS;  Service: Obstetrics;  Laterality: N/A;  . TONSILLECTOMY  2011    Gynecologic History: Patient's last menstrual period was 10/15/2018.  Obstetric History: G1P1001  Family History:  Family History  Problem Relation Age of Onset  . Cardiomyopathy Father   . Cardiomyopathy Paternal Grandfather   . Melanoma Paternal Grandmother   . Cerebral palsy Cousin   . Diabetes Neg Hx   . Hypertension Neg Hx   . Stroke Neg Hx   . Thyroid disease Neg Hx     Social History:  Social History   Socioeconomic History  . Marital status: Married    Spouse name: Not on file  . Number of children: 0  . Years of education: Not on file  .  Highest education level: Not on file  Occupational History  . Occupation: Sport and exercise psychologist    Comment: [employer]  . Occupation: Scientist, research (medical)  Social Needs  . Financial resource strain: Not on file  . Food insecurity    Worry: Not on file    Inability: Not on file  . Transportation needs    Medical: Not on file    Non-medical: Not on file  Tobacco Use  . Smoking status: Never Smoker  . Smokeless tobacco: Never Used  Substance and Sexual Activity  . Alcohol use: Yes    Comment: occasional  . Drug use: No  . Sexual activity: Not Currently    Partners: Male    Birth control/protection: None  Lifestyle  . Physical activity    Days per week: 2 days    Minutes per session: 30 min  . Stress: Rather much  Relationships  . Social Herbalist on phone: More than three times a week    Gets together: Twice a week    Attends religious service: Never    Active member of club or organization: Yes    Attends meetings of clubs or organizations: More than 4 times per year    Relationship status: Married  . Intimate partner violence    Fear of current or ex partner: No    Emotionally abused: No  Physically abused: No    Forced sexual activity: No  Other Topics Concern  . Not on file  Social History Narrative   Starting at [employer] for [employer] (fall 2015)                Allergies:  Allergies  Allergen Reactions  . Cephalexin Anaphylaxis  . Sulfa Antibiotics Anaphylaxis    Medications: Prior to Admission medications   Medication Sig Start Date End Date Taking? Authorizing Provider  escitalopram (LEXAPRO) 20 MG tablet TAKE 1 TABLET BY MOUTH EVERY DAY Patient not taking: Reported on 11/27/2018 10/18/18   [name], MD  Nitroglycerin 0.4 % OINT Apply 1 inch intra-anally every 12-hrs for 3 weeks Patient not taking: Reported on 04/06/2018 03/24/18   [name], MD  zolpidem (AMBIEN) 5 MG tablet Take 1 tablet (5 mg total) by mouth at bedtime  as needed for sleep. Patient not taking: Reported on 11/27/2018 11/16/18   Nadara Mustard, MD    Physical Exam Vitals:  Vitals:   11/27/18 1358  BP: 122/78   Patient's last menstrual period was 10/15/2018.  General: NAD HEENT: normocephalic, anicteric Pulmonary: No increased work of breathing Neurologic: Grossly intact Psychiatric: mood appropriate, affect full  Assessment: 28 y.o. G1P1001 with positive pregnancy test and cramping.  Plan: Problem List Items Addressed This Visit    None    Visit Diagnoses    Cramping affecting pregnancy, antepartum    -  Primary   Encounter for fetal ultrasound       Relevant Orders   US OB Transvaginal   Positive urine pregnancy test         Positive UPT today in the office. Discussed that symptoms may be related to an ovarian cyst. Comfort measures such as heating pad and Tylenol recommended. We decided not to do beta hCG tests at this time as she is not bleeding. Ultrasound ordered.  Marcelyn Bruins, CNM 11/27/2018  3:26 PM

## 2018-11-27 NOTE — Telephone Encounter (Signed)
Pt calling; has had two positive preg tests; started cramping last night like period cramps.  Normal?  No bleeding.  [phone]  Pt states has not had IC within 24hrs before this started.  Describes it as a burning cramping.  Tx'd to [facility] for scheduling.

## 2018-12-05 ENCOUNTER — Other Ambulatory Visit: Payer: Self-pay

## 2018-12-05 ENCOUNTER — Ambulatory Visit (INDEPENDENT_AMBULATORY_CARE_PROVIDER_SITE_OTHER): Payer: 59 | Admitting: Maternal Newborn

## 2018-12-05 ENCOUNTER — Other Ambulatory Visit (HOSPITAL_COMMUNITY)
Admission: RE | Admit: 2018-12-05 | Discharge: 2018-12-05 | Disposition: A | Discharge status: Home / Self Care | Payer: 59 | Source: Ambulatory Visit | Attending: Maternal Newborn | Admitting: Maternal Newborn

## 2018-12-05 ENCOUNTER — Encounter

## 2018-12-05 ENCOUNTER — Encounter: Payer: Self-pay | Admitting: Maternal Newborn

## 2018-12-05 ENCOUNTER — Ambulatory Visit (INDEPENDENT_AMBULATORY_CARE_PROVIDER_SITE_OTHER): Payer: 59

## 2018-12-05 VITALS — Wt 185.0 lb

## 2018-12-05 DIAGNOSIS — Z3A01 Less than 8 weeks gestation of pregnancy: Secondary | ICD-10-CM

## 2018-12-05 DIAGNOSIS — N8311 Corpus luteum cyst of right ovary: Secondary | ICD-10-CM

## 2018-12-05 DIAGNOSIS — Z3481 Encounter for supervision of other normal pregnancy, first trimester: Secondary | ICD-10-CM | POA: Diagnosis not present

## 2018-12-05 DIAGNOSIS — Z3490 Encounter for supervision of normal pregnancy, unspecified, unspecified trimester: Secondary | ICD-10-CM | POA: Insufficient documentation

## 2018-12-05 DIAGNOSIS — Z98891 History of uterine scar from previous surgery: Secondary | ICD-10-CM | POA: Insufficient documentation

## 2018-12-05 DIAGNOSIS — O3481 Maternal care for other abnormalities of pelvic organs, first trimester: Secondary | ICD-10-CM

## 2018-12-05 DIAGNOSIS — Z369 Encounter for antenatal screening, unspecified: Secondary | ICD-10-CM

## 2018-12-05 DIAGNOSIS — O09899 Supervision of other high risk pregnancies, unspecified trimester: Secondary | ICD-10-CM | POA: Insufficient documentation

## 2018-12-05 DIAGNOSIS — O09891 Supervision of other high risk pregnancies, first trimester: Secondary | ICD-10-CM

## 2018-12-05 DIAGNOSIS — O0990 Supervision of high risk pregnancy, unspecified, unspecified trimester: Secondary | ICD-10-CM | POA: Insufficient documentation

## 2018-12-05 DIAGNOSIS — O219 Vomiting of pregnancy, unspecified: Secondary | ICD-10-CM

## 2018-12-05 DIAGNOSIS — O34219 Maternal care for unspecified type scar from previous cesarean delivery: Secondary | ICD-10-CM

## 2018-12-05 MED ORDER — DOXYLAMINE-PYRIDOXINE 10-10 MG PO TBEC: 2.0000 | DELAYED_RELEASE_TABLET | Freq: Every day | ORAL | 3 refills | Status: DC

## 2018-12-05 NOTE — Patient Instructions (Signed)
First Trimester of Pregnancy The first trimester of pregnancy is from week 1 until the end of week 13 (months 1 through 3). A week after a sperm fertilizes an egg, the egg will implant on the wall of the uterus. This embryo will begin to develop into a baby. Genes from you and your partner will form the baby. The female genes will determine whether the baby will be a boy or a girl. At 6-8 weeks, the eyes and face will be formed, and the heartbeat can be seen on ultrasound. At the end of 12 weeks, all the baby's organs will be formed. Now that you are pregnant, you will want to do everything you can to have a healthy baby. Two of the most important things are to get good prenatal care and to follow your health care provider's instructions. Prenatal care is all the medical care you receive before the baby's birth. This care will help prevent, find, and treat any problems during the pregnancy and childbirth. Body changes during your first trimester Your body goes through many changes during pregnancy. The changes vary from woman to woman.  You may gain or lose a couple of pounds at first.  You may feel sick to your stomach (nauseous) and you may throw up (vomit). If the vomiting is uncontrollable, call your health care provider.  You may tire easily.  You may develop headaches that can be relieved by medicines. All medicines should be approved by your health care provider.  You may urinate more often. Painful urination may mean you have a bladder infection.  You may develop heartburn as a result of your pregnancy.  You may develop constipation because certain hormones are causing the muscles that push stool through your intestines to slow down.  You may develop hemorrhoids or swollen veins (varicose veins).  Your breasts may begin to grow larger and become tender. Your nipples may stick out more, and the tissue that surrounds them (areola) may become darker.  Your gums may bleed and may be  sensitive to brushing and flossing.  Dark spots or blotches (chloasma, mask of pregnancy) may develop on your face. This will likely fade after the baby is born.  Your menstrual periods will stop.  You may have a loss of appetite.  You may develop cravings for certain kinds of food.  You may have changes in your emotions from day to day, such as being excited to be pregnant or being concerned that something may go wrong with the pregnancy and baby.  You may have more vivid and strange dreams.  You may have changes in your hair. These can include thickening of your hair, rapid growth, and changes in texture. Some women also have hair loss during or after pregnancy, or hair that feels dry or thin. Your hair will most likely return to normal after your baby is born. What to expect at prenatal visits During a routine prenatal visit:  You will be weighed to make sure you and the baby are growing normally.  Your blood pressure will be taken.  Your abdomen will be measured to track your baby's growth.  The fetal heartbeat will be listened to between weeks 10 and 14 of your pregnancy.  Test results from any previous visits will be discussed. Your health care provider may ask you:  How you are feeling.  If you are feeling the baby move.  If you have had any abnormal symptoms, such as leaking fluid, bleeding, severe headaches, or abdominal   cramping.  If you are using any tobacco products, including cigarettes, chewing tobacco, and electronic cigarettes.  If you have any questions. Other tests that may be performed during your first trimester include:  Blood tests to find your blood type and to check for the presence of any previous infections. The tests will also be used to check for low iron levels (anemia) and protein on red blood cells (Rh antibodies). Depending on your risk factors, or if you previously had diabetes during pregnancy, you may have tests to check for high blood sugar  that affects pregnant women (gestational diabetes).  Urine tests to check for infections, diabetes, or protein in the urine.  An ultrasound to confirm the proper growth and development of the baby.  Fetal screens for spinal cord problems (spina bifida) and Down syndrome.  HIV (human immunodeficiency virus) testing. Routine prenatal testing includes screening for HIV, unless you choose not to have this test.  You may need other tests to make sure you and the baby are doing well. Follow these instructions at home: Medicines  Follow your health care provider's instructions regarding medicine use. Specific medicines may be either safe or unsafe to take during pregnancy.  Take a prenatal vitamin that contains at least 600 micrograms (mcg) of folic acid.  If you develop constipation, try taking a stool softener if your health care provider approves. Eating and drinking   Eat a balanced diet that includes fresh fruits and vegetables, whole grains, good sources of protein such as meat, eggs, or tofu, and low-fat dairy. Your health care provider will help you determine the amount of weight gain that is right for you.  Avoid raw meat and uncooked cheese. These carry germs that can cause birth defects in the baby.  Eating four or five small meals rather than three large meals a day may help relieve nausea and vomiting. If you start to feel nauseous, eating a few soda crackers can be helpful. Drinking liquids between meals, instead of during meals, also seems to help ease nausea and vomiting.  Limit foods that are high in fat and processed sugars, such as fried and sweet foods.  To prevent constipation: ? Eat foods that are high in fiber, such as fresh fruits and vegetables, whole grains, and beans. ? Drink enough fluid to keep your urine clear or pale yellow. Activity  Exercise only as directed by your health care provider. Most women can continue their usual exercise routine during  pregnancy. Try to exercise for 30 minutes at least 5 days a week. Exercising will help you: ? Control your weight. ? Stay in shape. ? Be prepared for labor and delivery.  Experiencing pain or cramping in the lower abdomen or lower back is a good sign that you should stop exercising. Check with your health care provider before continuing with normal exercises.  Try to avoid standing for long periods of time. Move your legs often if you must stand in one place for a long time.  Avoid heavy lifting.  Wear low-heeled shoes and practice good posture.  You may continue to have sex unless your health care provider tells you not to. Relieving pain and discomfort  Wear a good support bra to relieve breast tenderness.  Take warm sitz baths to soothe any pain or discomfort caused by hemorrhoids. Use hemorrhoid cream if your health care provider approves.  Rest with your legs elevated if you have leg cramps or low back pain.  If you develop varicose veins in   your legs, wear support hose. Elevate your feet for 15 minutes, 3-4 times a day. Limit salt in your diet. Prenatal care  Schedule your prenatal visits by the twelfth week of pregnancy. They are usually scheduled monthly at first, then more often in the last 2 months before delivery.  Write down your questions. Take them to your prenatal visits.  Keep all your prenatal visits as told by your health care provider. This is important. Safety  Wear your seat belt at all times when driving.  Make a list of emergency phone numbers, including numbers for family, friends, the hospital, and police and fire departments. General instructions  Ask your health care provider for a referral to a local prenatal education class. Begin classes no later than the beginning of month 6 of your pregnancy.  Ask for help if you have counseling or nutritional needs during pregnancy. Your health care provider can offer advice or refer you to specialists for help  with various needs.  Do not use hot tubs, steam rooms, or saunas.  Do not douche or use tampons or scented sanitary pads.  Do not cross your legs for long periods of time.  Avoid cat litter boxes and soil used by cats. These carry germs that can cause birth defects in the baby and possibly loss of the fetus by miscarriage or stillbirth.  Avoid all smoking, herbs, alcohol, and medicines not prescribed by your health care provider. Chemicals in these products affect the formation and growth of the baby.  Do not use any products that contain nicotine or tobacco, such as cigarettes and e-cigarettes. If you need help quitting, ask your health care provider. You may receive counseling support and other resources to help you quit.  Schedule a dentist appointment. At home, brush your teeth with a soft toothbrush and be gentle when you floss. Contact a health care provider if:  You have dizziness.  You have mild pelvic cramps, pelvic pressure, or nagging pain in the abdominal area.  You have persistent nausea, vomiting, or diarrhea.  You have a bad smelling vaginal discharge.  You have pain when you urinate.  You notice increased swelling in your face, hands, legs, or ankles.  You are exposed to fifth disease or chickenpox.  You are exposed to German measles (rubella) and have never had it. Get help right away if:  You have a fever.  You are leaking fluid from your vagina.  You have spotting or bleeding from your vagina.  You have severe abdominal cramping or pain.  You have rapid weight gain or loss.  You vomit blood or material that looks like coffee grounds.  You develop a severe headache.  You have shortness of breath.  You have any kind of trauma, such as from a fall or a car accident. Summary  The first trimester of pregnancy is from week 1 until the end of week 13 (months 1 through 3).  Your body goes through many changes during pregnancy. The changes vary from  woman to woman.  You will have routine prenatal visits. During those visits, your health care provider will examine you, discuss any test results you may have, and talk with you about how you are feeling. This information is not intended to replace advice given to you by your health care provider. Make sure you discuss any questions you have with your health care provider. Document Released: 01/05/2001 Document Revised: 12/24/2016 Document Reviewed: 12/24/2015 Elsevier Patient Education  2020 Elsevier Inc.  

## 2018-12-05 NOTE — Progress Notes (Signed)
NOB today. U/s today.

## 2018-12-05 NOTE — Progress Notes (Signed)
12/05/2018   Chief Complaint: Desires prenatal care.  Transfer of Care Patient: No  History of Present Illness: Stacey Curtis is a 28 y.o. G2P1001 at [redacted]w[redacted]d based on Patient's last menstrual period on 10/15/2018, with an Estimated Date of Delivery: 07/22/2019, with the above CC.   Her periods were: regular periods every month She was using no method when she conceived.  She has Positive signs or symptoms of nausea/vomiting of pregnancy. Rx for Diclegis. She has Negative signs or symptoms of miscarriage or preterm labor; had some cramping and pink spotting last week but this has ceased. She identifies Negative Zika risk factors for her and her partner On any different medications around the time she conceived/early pregnancy: Yes , was on Lexapro but stopped with positive pregnancy test History of varicella: No   Review of Systems  Constitutional: Positive for malaise/fatigue.  HENT: Negative.   Eyes: Negative.   Respiratory: Negative.   Cardiovascular: Negative.   Gastrointestinal: Positive for nausea and vomiting.  Genitourinary: Negative.   Musculoskeletal: Negative.   Skin: Negative.   Neurological: Negative.   Endo/Heme/Allergies: Negative.   Psychiatric/Behavioral: Negative.    Review of systems was otherwise negative, except as stated in the above HPI.  OBGYN History: As per HPI. OB History  Gravida Para Term Preterm AB Living  2 1 1  0 0 1  SAB TAB Ectopic Multiple Live Births  0 0 0 0 1    # Outcome Date GA Lbr Len/2nd Weight Sex Delivery Anes PTL Lv  2 Current           1 Term             Any issues with any prior pregnancies: yes, active phase arrest with Cesarean delivery with G1 Any prior children are healthy, doing well, without any problems or issues: yes History of pap smears: Yes. Last pap smear 05/05/2017. NILM. History of STIs: No   Past Medical History: Past Medical History:  Diagnosis Date  . Anxiety and depression   . Common migraine   .  Nephrolithiasis   . PATELLO-FEMORAL SYNDROME 08/16/2008   Qualifier: Diagnosis of  By: 08/18/2008 MD, Patsy Lager    . Recurrent cystitis     Past Surgical History: Past Surgical History:  Procedure Laterality Date  . CESAREAN SECTION N/A 02/22/2018   Procedure: CESAREAN SECTION;  Surgeon: 02/24/2018, MD;  Location: [facility] ORS;  Service: Obstetrics;  Laterality: N/A;  . TONSILLECTOMY  2011    Family History:  Family History  Problem Relation Age of Onset  . Cardiomyopathy Father   . Cardiomyopathy Paternal Grandfather   . Melanoma Paternal Grandmother   . Cerebral palsy Cousin   . Diabetes Neg Hx   . Hypertension Neg Hx   . Stroke Neg Hx   . Thyroid disease Neg Hx    She does not have a history of any female cancers, bleeding or blood clotting disorders.  There is no history of intellectual disability, birth defects or genetic disorders in her or the FOB's history  Social History:  Social History   Socioeconomic History  . Marital status: Married    Spouse name: Not on file  . Number of children: 0  . Years of education: Not on file  . Highest education level: Not on file  Occupational History  . Occupation: 2012    Comment: [employer]  . Occupation: Gaffer  Social Needs  . Financial resource strain: Not on file  . Food insecurity  Worry: Not on file    Inability: Not on file  . Transportation needs    Medical: Not on file    Non-medical: Not on file  Tobacco Use  . Smoking status: Never Smoker  . Smokeless tobacco: Never Used  Substance and Sexual Activity  . Alcohol use: Yes    Comment: occasional  . Drug use: No  . Sexual activity: Not Currently    Partners: Male    Birth control/protection: None  Lifestyle  . Physical activity    Days per week: 2 days    Minutes per session: 30 min  . Stress: Rather much  Relationships  . Social Musicianconnections    Talks on phone: More than three times a week    Gets together: Twice a week     Attends religious service: Never    Active member of club or organization: Yes    Attends meetings of clubs or organizations: More than 4 times per year    Relationship status: Married  . Intimate partner violence    Fear of current or ex partner: No    Emotionally abused: No    Physically abused: No    Forced sexual activity: No  Other Topics Concern  . Not on file  Social History Narrative   Starting at [employer] for [employer] (fall 2015)               Any cats in the household: no Domestic violence screening is negative today.  Allergy: Allergies  Allergen Reactions  . Cephalexin Anaphylaxis  . Sulfa Antibiotics Anaphylaxis    Current Outpatient Medications:  Current Outpatient Medications:  .  Prenatal Vit-Fe Fumarate-FA (PRENATAL VITAMIN PO), Take by mouth., Disp: , Rfl:   Physical Exam:   Wt 185 lb (83.9 kg)   LMP 10/15/2018   BMI 30.79 kg/m  Body mass index is 30.79 kg/m. Constitutional: Well nourished, well developed female in no acute distress.  Neck:  Supple, normal appearance, and no thyromegaly  Cardiovascular: S1, S2 normal, no murmur, rub or gallop, regular rate and rhythm Respiratory:  Clear to auscultation bilateral. Normal respiratory effort Abdomen: non tender, non distended Breasts:  patient declines to have breast exam. Neuro/Psych:  Normal mood and affect.  Skin:  Warm and dry.   Pelvic exam: Declined after shared decision-making  Assessment: Stacey Curtis is a 28 y.o. G2P1001 at [redacted]w[redacted]d based on Patient's last menstrual period on 10/15/2018, with an Estimated Date of Delivery: 07/22/2019, presenting for prenatal care.  Plan:  1) Avoid alcoholic beverages. 2) Patient encouraged not to smoke.  3) Discontinue the use of all non-medicinal drugs and chemicals.  4) Take prenatal vitamins daily.  5) Seatbelt use advised 6) Nutrition, food safety and exercise discussed. 7) [facility]  8)  Patient is asked about travel to areas at risk for the Zika virus, and counseled to avoid travel and exposure to mosquitoes or  partners who may have themselves been exposed to the virus.  9) Genetic Screening, such as with 1st Trimester Screening, cell free fetal DNA, AFP testing, and Ultrasound, is discussed with patient. She plans to have genetic testing this pregnancy. 10) Breastfeeding discussed, currently weaning 429 month old but will feed expressed milk until 28 year old and plans to breastfeed for this baby. 11) Dating scan today shows singleton IUP, size=dates, FHR 132 bpm. 12) NOB labs next visit with [id number].  Problem list reviewed and updated.  Marcelyn BruinsJacelyn Schmid, CNM [facility],  [facility] 12/05/2018  2:01 PM

## 2018-12-06 LAB — URINE DRUG PANEL 7
Amphetamines, Urine: NEGATIVE ng/mL
Barbiturate Quant, Ur: NEGATIVE ng/mL
Benzodiazepine Quant, Ur: NEGATIVE ng/mL
Cannabinoid Quant, Ur: NEGATIVE ng/mL
Cocaine (Metab.): NEGATIVE ng/mL
Opiate Quant, Ur: NEGATIVE ng/mL
PCP Quant, Ur: NEGATIVE ng/mL

## 2018-12-07 LAB — URINE CULTURE

## 2018-12-07 LAB — URINE CYTOLOGY ANCILLARY ONLY
Chlamydia: NEGATIVE
Comment: NEGATIVE
Comment: NORMAL
Neisseria Gonorrhea: NEGATIVE

## 2018-12-26 ENCOUNTER — Other Ambulatory Visit: Payer: Self-pay

## 2018-12-26 ENCOUNTER — Encounter: Payer: Self-pay | Admitting: Obstetrics and Gynecology

## 2018-12-26 ENCOUNTER — Ambulatory Visit: Payer: 59 | Admitting: Obstetrics and Gynecology

## 2018-12-26 VITALS — BP 122/74 | Wt 186.0 lb

## 2018-12-26 DIAGNOSIS — Z98891 History of uterine scar from previous surgery: Secondary | ICD-10-CM

## 2018-12-26 DIAGNOSIS — O09891 Supervision of other high risk pregnancies, first trimester: Secondary | ICD-10-CM

## 2018-12-26 DIAGNOSIS — Z3A10 10 weeks gestation of pregnancy: Secondary | ICD-10-CM

## 2018-12-26 DIAGNOSIS — O34219 Maternal care for unspecified type scar from previous cesarean delivery: Secondary | ICD-10-CM

## 2018-12-26 DIAGNOSIS — Z1379 Encounter for other screening for genetic and chromosomal anomalies: Secondary | ICD-10-CM

## 2018-12-26 DIAGNOSIS — O09899 Supervision of other high risk pregnancies, unspecified trimester: Secondary | ICD-10-CM

## 2018-12-26 DIAGNOSIS — Z3481 Encounter for supervision of other normal pregnancy, first trimester: Secondary | ICD-10-CM

## 2018-12-26 DIAGNOSIS — Z113 Encounter for screening for infections with a predominantly sexual mode of transmission: Secondary | ICD-10-CM

## 2018-12-26 NOTE — Progress Notes (Signed)
  Routine Prenatal Care Visit  Subjective  Stacey Curtis is a 28 y.o. G2P1001 at [redacted]w[redacted]d being seen today for ongoing prenatal care.  She is currently monitored for the following issues for this low-risk pregnancy and has COMMON MIGRAINE; FIBROCYSTIC BREAST DISEASE, MINIMAL; Disturbed concentration; Anxiety and depression; Recurrent cystitis; Supervision of normal pregnancy; History of cesarean delivery; and Short interval between pregnancies affecting pregnancy, antepartum on their problem list.  ----------------------------------------------------------------------------------- Patient reports no complaints.    . Vag. Bleeding: None.   . Leaking Fluid denies.  ----------------------------------------------------------------------------------- The following portions of the patient's history were reviewed and updated as appropriate: allergies, current medications, past family history, past medical history, past social history, past surgical history and problem list. Problem list updated.  Objective  Blood pressure 122/74, weight 186 lb (84.4 kg), last menstrual period 10/15/2018, currently breastfeeding. Pregravid weight 185 lb (83.9 kg) Total Weight Gain 1 lb (0.454 kg) Urinalysis: Urine Protein    Urine Glucose    Fetal Status: Fetal Heart Rate (bpm): 177         General:  Alert, oriented and cooperative. Patient is in no acute distress.  Skin: Skin is warm and dry. No rash noted.   Cardiovascular: Normal heart rate noted  Respiratory: Normal respiratory effort, no problems with respiration noted  Abdomen: Soft, gravid, appropriate for gestational age. Pain/Pressure: Absent     Pelvic:  Cervical exam deferred        Extremities: Normal range of motion.     Mental Status: Normal mood and affect. Normal behavior. Normal judgment and thought content.   Assessment   28 y.o. G2P1001 at [redacted]w[redacted]d by  07/22/2019, by Last Menstrual Period presenting for routine prenatal visit  Plan    pregnancy Problems (from 12/05/18 to present)    Problem Noted Resolved   Supervision of normal pregnancy 12/05/2018 by Rexene Agent, CNM No   Overview Addendum 12/05/2018  2:51 PM by Rexene Agent, [place] Prenatal Labs  Dating L=6 Blood type: --/--/A POS (01/27 0146)   Genetic Screen 1 Screen:    AFP:     Quad:     NIPS: Antibody:NEG (01/27 0146)  Anatomic [place]  Rubella:   Varicella:    GTT Early:               Third trimester:  RPR: Non Reactive (01/27 0101)   Rhogam  HBsAg:     TDaP vaccine                       Flu Shot: HIV: Non Reactive (11/12 0919)   Baby Food                                [id number]-- (01/10 1456)  Contraception  Pap: 05/05/2017, NILM  CBB     CS/VBAC    Support Person                  Preterm labor symptoms and general obstetric precautions including but not limited to vaginal bleeding, contractions, leaking of fluid and fetal movement were reviewed in detail with the patient. Please refer to After Visit Summary for other counseling recommendations.   - NIPT and NOB labs today  Return in about 4 weeks (around 01/23/2019) for Routine Prenatal Appointment.  Prentice Docker, MD, [facility], [facility] 12/26/2018 2:38 PM

## 2018-12-28 LAB — RPR+RH+ABO+RUB AB+AB SCR+CB...
Antibody Screen: NEGATIVE
HIV Screen 4th Generation wRfx: NONREACTIVE
Hematocrit: 38.2 % (ref 34.0–46.6)
Hemoglobin: 13.1 g/dL (ref 11.1–15.9)
Hepatitis B Surface Ag: NEGATIVE
MCH: 30.8 pg (ref 26.6–33.0)
MCHC: 34.3 g/dL (ref 31.5–35.7)
MCV: 90 fL (ref 79–97)
Platelets: 299 10*3/uL (ref 150–450)
RBC: 4.26 x10E6/uL (ref 3.77–5.28)
RDW: 12.4 % (ref 11.7–15.4)
RPR Ser Ql: NONREACTIVE
Rh Factor: POSITIVE
Rubella Antibodies, IGG: 8.16 index (ref >0.99)
Varicella zoster IgG: 2096 index (ref >165)
WBC: 7.4 10*3/uL (ref 3.4–10.8)

## 2018-12-28 LAB — HEMOGLOBINOPATHY EVALUATION
HGB C: 0 %
HGB S: 0 %
HGB VARIANT: 0 %
Hemoglobin A2 Quantitation: 2.3 % (ref 1.8–3.2)
Hemoglobin F Quantitation: 0 % (ref 0.0–2.0)
Hgb A: 97.7 % (ref 96.4–98.8)

## 2019-01-02 ENCOUNTER — Other Ambulatory Visit: Payer: Self-pay | Admitting: Obstetrics and Gynecology

## 2019-01-02 MED ORDER — NYSTATIN 100000 UNIT/GM EX CREA: 1.0000 "application " | TOPICAL_CREAM | Freq: Two times a day (BID) | CUTANEOUS | 1 refills | Status: DC

## 2019-01-04 ENCOUNTER — Telehealth: Payer: Self-pay | Admitting: Obstetrics and Gynecology

## 2019-01-04 NOTE — Telephone Encounter (Signed)
Patient is aware 

## 2019-01-04 NOTE — Telephone Encounter (Signed)
Her NOB labs are back and normal. Her blood type is A+. Her genetic testing is not back yet.

## 2019-01-04 NOTE — Telephone Encounter (Signed)
Pt called asking about test results. I didn't see them in but pt would still like a call back.

## 2019-01-05 LAB — MATERNIT21 PLUS CORE+SCA
Fetal Fraction: 3
Monosomy X (Turner Syndrome): NOT DETECTED
Result (T21): NEGATIVE
Trisomy 13 (Patau syndrome): NEGATIVE
Trisomy 18 (Edwards syndrome): NEGATIVE
Trisomy 21 (Down syndrome): NEGATIVE
XXX (Triple X Syndrome): NOT DETECTED
XXY (Klinefelter Syndrome): NOT DETECTED
XYY (Jacobs Syndrome): NOT DETECTED

## 2019-01-23 ENCOUNTER — Ambulatory Visit (INDEPENDENT_AMBULATORY_CARE_PROVIDER_SITE_OTHER): Payer: 59 | Admitting: Certified Nurse Midwife

## 2019-01-23 ENCOUNTER — Other Ambulatory Visit: Payer: Self-pay

## 2019-01-23 ENCOUNTER — Encounter: Payer: 59 | Admitting: Certified Nurse Midwife

## 2019-01-23 VITALS — BP 120/80 | Wt 179.0 lb

## 2019-01-23 DIAGNOSIS — O09892 Supervision of other high risk pregnancies, second trimester: Secondary | ICD-10-CM

## 2019-01-23 DIAGNOSIS — Z3482 Encounter for supervision of other normal pregnancy, second trimester: Secondary | ICD-10-CM

## 2019-01-23 DIAGNOSIS — O34219 Maternal care for unspecified type scar from previous cesarean delivery: Secondary | ICD-10-CM

## 2019-01-23 DIAGNOSIS — O09899 Supervision of other high risk pregnancies, unspecified trimester: Secondary | ICD-10-CM

## 2019-01-23 DIAGNOSIS — Z98891 History of uterine scar from previous surgery: Secondary | ICD-10-CM

## 2019-01-23 DIAGNOSIS — Z3A14 14 weeks gestation of pregnancy: Secondary | ICD-10-CM

## 2019-01-23 LAB — POCT URINALYSIS DIPSTICK OB
Glucose, UA: NEGATIVE
POC,PROTEIN,UA: NEGATIVE

## 2019-01-23 NOTE — Progress Notes (Signed)
ROB at 14wk2d: Has had some tenderness around scar, difficulty sleeping without Unisom, decreased libido. Nausea and vomiting much improved. MaterniT 21 was negative.  FHTs WNL (160) ( RTO in 2 weeks for ROB/ MSAFP Consider hemoglobin A1C at next visit (had elevated 1 hour GTT with last pregnancy) Anatomy scan in 4-5 weeks.  Dalia Heading, CNM

## 2019-02-06 ENCOUNTER — Ambulatory Visit (INDEPENDENT_AMBULATORY_CARE_PROVIDER_SITE_OTHER): Payer: 59 | Admitting: Certified Nurse Midwife

## 2019-02-06 ENCOUNTER — Other Ambulatory Visit: Payer: Self-pay

## 2019-02-06 VITALS — BP 100/70 | Wt 178.0 lb

## 2019-02-06 DIAGNOSIS — Z1379 Encounter for other screening for genetic and chromosomal anomalies: Secondary | ICD-10-CM

## 2019-02-06 DIAGNOSIS — Z6829 Body mass index (BMI) 29.0-29.9, adult: Secondary | ICD-10-CM

## 2019-02-06 DIAGNOSIS — Z363 Encounter for antenatal screening for malformations: Secondary | ICD-10-CM

## 2019-02-06 DIAGNOSIS — Z3482 Encounter for supervision of other normal pregnancy, second trimester: Secondary | ICD-10-CM

## 2019-02-06 DIAGNOSIS — Z3A16 16 weeks gestation of pregnancy: Secondary | ICD-10-CM

## 2019-02-06 LAB — POCT URINALYSIS DIPSTICK OB
Glucose, UA: NEGATIVE
POC,PROTEIN,UA: NEGATIVE

## 2019-02-06 NOTE — Progress Notes (Signed)
C/o no concerns.rj 

## 2019-02-07 ENCOUNTER — Encounter: Payer: 59 | Admitting: Advanced Practice Midwife

## 2019-02-07 NOTE — Progress Notes (Signed)
ROB at 16wk2d for labs: Feeling OK. No concerns verbalized FHTs WNL MSAFP and hemoglobin A1C drawn today. RTO for anatomy scan 2 Feb. Farrel Conners, CNM

## 2019-02-08 LAB — AFP, SERUM, OPEN SPINA BIFIDA
AFP MoM: 0.85
AFP Value: 26.3 ng/mL
Gest. Age on Collection Date: 16.3 weeks
Maternal Age At EDD: 28.6 yr
OSBR Risk 1 IN: 10000
Test Results:: NEGATIVE
Weight: 178 [lb_av]

## 2019-02-08 LAB — HEMOGLOBIN A1C
Est. average glucose Bld gHb Est-mCnc: 100 mg/dL
Hgb A1c MFr Bld: 5.1 % (ref 4.8–5.6)

## 2019-02-27 ENCOUNTER — Ambulatory Visit (INDEPENDENT_AMBULATORY_CARE_PROVIDER_SITE_OTHER): Payer: 59

## 2019-02-27 ENCOUNTER — Other Ambulatory Visit: Payer: Self-pay

## 2019-02-27 ENCOUNTER — Ambulatory Visit (INDEPENDENT_AMBULATORY_CARE_PROVIDER_SITE_OTHER): Payer: 59 | Admitting: Certified Nurse Midwife

## 2019-02-27 VITALS — BP 106/50 | Temp 97.0°F | Wt 180.0 lb

## 2019-02-27 DIAGNOSIS — Z363 Encounter for antenatal screening for malformations: Secondary | ICD-10-CM

## 2019-02-27 DIAGNOSIS — Z3A19 19 weeks gestation of pregnancy: Secondary | ICD-10-CM

## 2019-02-27 DIAGNOSIS — Z3482 Encounter for supervision of other normal pregnancy, second trimester: Secondary | ICD-10-CM

## 2019-02-27 DIAGNOSIS — O321XX0 Maternal care for breech presentation, not applicable or unspecified: Secondary | ICD-10-CM | POA: Diagnosis not present

## 2019-02-27 LAB — POCT URINALYSIS DIPSTICK OB
Glucose, UA: NEGATIVE
POC,PROTEIN,UA: NEGATIVE

## 2019-02-27 NOTE — Progress Notes (Signed)
No concerns.rj 

## 2019-02-27 NOTE — Progress Notes (Signed)
Stacey Curtis and anatomy scan at 19wk2d: Doing well. +FM. CGA 19wk1d, normal anatomy, breech posterior fundal placenta, female gender Normal MSAFP and normal hemoglobin A1C  A: IUP at 19wk2d S=D  P: Stacey Curtis  In 4 weeks  Farrel Conners, CNM

## 2019-03-27 ENCOUNTER — Encounter: Payer: Self-pay | Admitting: Obstetrics & Gynecology

## 2019-03-27 ENCOUNTER — Other Ambulatory Visit: Payer: Self-pay

## 2019-03-27 ENCOUNTER — Ambulatory Visit (INDEPENDENT_AMBULATORY_CARE_PROVIDER_SITE_OTHER): Payer: 59 | Admitting: Obstetrics & Gynecology

## 2019-03-27 VITALS — BP 114/60 | Wt 182.0 lb

## 2019-03-27 DIAGNOSIS — Z3482 Encounter for supervision of other normal pregnancy, second trimester: Secondary | ICD-10-CM

## 2019-03-27 DIAGNOSIS — Z98891 History of uterine scar from previous surgery: Secondary | ICD-10-CM

## 2019-03-27 DIAGNOSIS — O34211 Maternal care for low transverse scar from previous cesarean delivery: Secondary | ICD-10-CM

## 2019-03-27 DIAGNOSIS — Z3A23 23 weeks gestation of pregnancy: Secondary | ICD-10-CM

## 2019-03-27 LAB — POCT URINALYSIS DIPSTICK OB
Glucose, UA: NEGATIVE
POC,PROTEIN,UA: NEGATIVE

## 2019-03-27 NOTE — Patient Instructions (Signed)

## 2019-03-27 NOTE — Progress Notes (Signed)
Subjective  Fetal Movement? yes Contractions? no Leaking Fluid? no Vaginal Bleeding? no  Objective  BP 114/60   Wt 182 lb (82.6 kg)   LMP 10/15/2018   BMI 30.29 kg/m  General: NAD Pumonary: no increased work of breathing Abdomen: gravid, non-tender Extremities: no edema Psychiatric: mood appropriate, affect full  Assessment  29 y.o. G2P1001 at [redacted]w[redacted]d by  07/22/2019, by Last Menstrual Period presenting for routine prenatal visit  Plan   Problem List Items Addressed This Visit      Other   Supervision of normal pregnancy   History of cesarean delivery    Other Visit Diagnoses    [redacted] weeks gestation of pregnancy    -  Primary   Relevant Orders   POC Urinalysis Dipstick OB (Completed)    OB/GYN  Counseling Note  29 y.o. G2P1001 at [redacted]w[redacted]d with Estimated Date of Delivery: 07/22/19 was seen today in office to discuss trial of labor after cesarean section (TOLAC) versus elective repeat cesarean delivery (ERCD). The following risks were discussed with the patient.  Risk of uterine rupture at term is 0.78 percent with TOLAC and 0.22 percent with ERCD. 1 in 10 uterine ruptures will result in neonatal death or neurological injury. The benefits of a trial of labor after cesarean (TOLAC) resulting in a vaginal birth after cesarean (VBAC) include the following: shorter length of hospital stay and postpartum recovery (in most cases); fewer complications, such as postpartum fever, wound or uterine infection, thromboembolism (blood clots in the leg or lung), need for blood transfusion and fewer neonatal breathing problems.  The risks of an attempted VBAC or TOLAC include the following: Risk of failed trial of labor after cesarean (TOLAC) without a vaginal birth after cesarean (VBAC) resulting in repeat cesarean delivery (RCD) in about 20 to 63 percent of women who attempt VBAC.  Risk of rupture of uterus resulting in an emergency cesarean delivery. The risk of uterine rupture may be related in  part to the type of uterine incision made during the first cesarean delivery. A previous transverse uterine incision has the lowest risk of rupture (0.2 to 1.5 percent risk). Vertical or T-shaped uterine incisions have a higher risk of uterine rupture (4 to 9 percent risk)The risk of fetal death is very low with both VBAC and elective repeat cesarean delivery (ERCD), but the likelihood of fetal death is higher with VBAC than with ERCD. Maternal death is very rare with either type of delivery.  The risks of an elective repeat cesarean delivery (ERCD) were reviewed with the patient including but not limited to: 02/998 risk of uterine rupture which could have serious consequences, bleeding which may require transfusion; infection which may require antibiotics; injury to bowel, bladder or other surrounding organs (bowel, bladder, ureters); injury to the fetus; need for additional procedures including hysterectomy in the event of a life-threatening hemorrhage; thromboembolic phenomenon; abnormal placentation; incisional problems; death and other postoperative or anesthesia complications.    Desires VBAC  pregnancy Problems (from 12/05/18 to present)    Problem Noted Resolved   Supervision of normal pregnancy 12/05/2018 by Rexene Agent, CNM No   Overview Addendum 03/27/2019  3:30 PM by Gae Dry, MD    Clinic [facility] Prenatal Labs  Dating L=6 Blood type: --/--/A POS (01/27 0146)   Genetic Screen AFP:  neg   NIPS: diploid XY Antibody:NEG (01/27 0146)  Anatomic [place] Normal anatomy, posterior fundal placenta, female gender Rubella:   Immune Varicella:  Immune  GTT  Third trimester:  RPR: Non Reactive (01/27 0101)   Rhogam n/a HBsAg:   negative  TDaP vaccine           Flu Shot:declines HIV: Non Reactive (11/12 0919)   Baby Food   Breast                             GBS:  Contraception   Unsure Pap: 05/05/2017, NILM  CBB  No   CS/VBAC VBAC desired; CS for CPD   Support Person                 All BC options d/w pt today       Unsure    PNV  Pains related to round lig, adhesions from prior CS discussed; monitor   The following were addressed during this visit:  Breastfeeding Education - Early initiation of breastfeeding  - The importance of exclusive breastfeeding  - Risks of giving your baby anything other than breast milk if you are breastfeeding  - Nonpharmacological pain relief methods for labor  - The importance of early skin-to-skin contact  - Rooming-in on a 24-hour basis  - Feeding on demand or baby-led feeding  - Frequent feeding to help assure optimal milk production  - Effective positioning and attachment  - Exclusive breastfeeding for the first 6 months  - Individualized Education   22-27 weeks - Childbirth Education  - Fetal Growth and Movement    Annamarie Major, MD, [facility] 03/27/2019  3:49 PM

## 2019-04-24 ENCOUNTER — Encounter: Payer: Self-pay | Admitting: Obstetrics and Gynecology

## 2019-04-24 ENCOUNTER — Ambulatory Visit (INDEPENDENT_AMBULATORY_CARE_PROVIDER_SITE_OTHER): Payer: 59 | Admitting: Obstetrics and Gynecology

## 2019-04-24 ENCOUNTER — Other Ambulatory Visit: Payer: Self-pay

## 2019-04-24 ENCOUNTER — Other Ambulatory Visit: Payer: 59

## 2019-04-24 VITALS — BP 118/74 | Wt 185.0 lb

## 2019-04-24 DIAGNOSIS — Z3483 Encounter for supervision of other normal pregnancy, third trimester: Secondary | ICD-10-CM

## 2019-04-24 DIAGNOSIS — Z113 Encounter for screening for infections with a predominantly sexual mode of transmission: Secondary | ICD-10-CM

## 2019-04-24 DIAGNOSIS — Z131 Encounter for screening for diabetes mellitus: Secondary | ICD-10-CM

## 2019-04-24 DIAGNOSIS — O34219 Maternal care for unspecified type scar from previous cesarean delivery: Secondary | ICD-10-CM

## 2019-04-24 DIAGNOSIS — O09899 Supervision of other high risk pregnancies, unspecified trimester: Secondary | ICD-10-CM

## 2019-04-24 DIAGNOSIS — Z98891 History of uterine scar from previous surgery: Secondary | ICD-10-CM

## 2019-04-24 DIAGNOSIS — O09892 Supervision of other high risk pregnancies, second trimester: Secondary | ICD-10-CM

## 2019-04-24 DIAGNOSIS — F329 Major depressive disorder, single episode, unspecified: Secondary | ICD-10-CM

## 2019-04-24 DIAGNOSIS — Z3A27 27 weeks gestation of pregnancy: Secondary | ICD-10-CM

## 2019-04-24 DIAGNOSIS — F419 Anxiety disorder, unspecified: Secondary | ICD-10-CM

## 2019-04-24 DIAGNOSIS — F32A Depression, unspecified: Secondary | ICD-10-CM

## 2019-04-24 DIAGNOSIS — O99342 Other mental disorders complicating pregnancy, second trimester: Secondary | ICD-10-CM

## 2019-04-24 NOTE — Progress Notes (Signed)
Routine Prenatal Care Visit  Subjective  Stacey Curtis is a 29 y.o. G2P1001 at [redacted]w[redacted]d being seen today for ongoing prenatal care.  She is currently monitored for the following issues for this low-risk pregnancy and has COMMON MIGRAINE; FIBROCYSTIC BREAST DISEASE, MINIMAL; Disturbed concentration; Anxiety and depression; Recurrent cystitis; Supervision of normal pregnancy; History of cesarean delivery; and Short interval between pregnancies affecting pregnancy, antepartum on their problem list.  ----------------------------------------------------------------------------------- Patient reports right sciatic pain.    . Vag. Bleeding: None.  Movement: Present. Leaking Fluid denies.  ----------------------------------------------------------------------------------- The following portions of the patient's history were reviewed and updated as appropriate: allergies, current medications, past family history, past medical history, past social history, past surgical history and problem list. Problem list updated.  Objective  Blood pressure 118/74, weight 185 lb (83.9 kg), last menstrual period 10/15/2018, currently breastfeeding. Pregravid weight 185 lb (83.9 kg) Total Weight Gain 0 lb (0 kg) Urinalysis: Urine Protein    Urine Glucose    Fetal Status: Fetal Heart Rate (bpm): 150 Fundal Height: 27 cm Movement: Present     General:  Alert, oriented and cooperative. Patient is in no acute distress.  Skin: Skin is warm and dry. No rash noted.   Cardiovascular: Normal heart rate noted  Respiratory: Normal respiratory effort, no problems with respiration noted  Abdomen: Soft, gravid, appropriate for gestational age. Pain/Pressure: Absent     Pelvic:  Cervical exam deferred        Extremities: Normal range of motion.  Edema: None  Mental Status: Normal mood and affect. Normal behavior. Normal judgment and thought content.   Assessment   29 y.o. G2P1001 at [redacted]w[redacted]d by  07/22/2019, by Last  Menstrual Period presenting for routine prenatal visit  Plan   pregnancy Problems (from 12/05/18 to present)    Problem Noted Resolved   Supervision of normal pregnancy 12/05/2018 by Oswaldo Conroy, CNM No   Overview Addendum 03/27/2019  3:51 PM by Nadara Mustard, MD    [facility] Prenatal Labs  Dating L=6 Blood type: --/--/A POS (01/27 0146)   Genetic Screen AFP:  neg   NIPS: diploid XY Antibody:NEG (01/27 0146)  Anatomic [place] Normal anatomy, posterior fundal placenta, female gender Rubella:   Immune Varicella:  Immune  GTT     Third trimester:  RPR: Non Reactive (01/27 0101)   Rhogam n/a HBsAg:   negative  TDaP vaccine           Flu Shot:declines HIV: Non Reactive (11/12 0919)   Baby Food         Breast                       GBS:  Contraception         Unsure Pap: 05/05/2017, NILM  CBB  No   CS/VBAC VBAC desired; CS for CPD   Support Person                  Preterm labor symptoms and general obstetric precautions including but not limited to vaginal bleeding, contractions, leaking of fluid and fetal movement were reviewed in detail with the patient. Please refer to After Visit Summary for other counseling recommendations.   - revisited follow up questions regarding TOLAC.  She would like to try a TOLAC.  VBAC calculator chance of success: 54%.  - 28 week labs today.  Return in about 2 weeks (around 05/08/2019) for Routine Prenatal Appointment.  Thomasene Mohair, MD, [facility], [facility] 04/24/2019 10:28 AM

## 2019-04-25 LAB — 28 WEEK RH+PANEL
Basophils Absolute: 0.1 10*3/uL (ref 0.0–0.2)
Basos: 1 %
EOS (ABSOLUTE): 0.1 10*3/uL (ref 0.0–0.4)
Eos: 1 %
Gestational Diabetes Screen: 129 mg/dL (ref 65–139)
HIV Screen 4th Generation wRfx: NONREACTIVE
Hematocrit: 38 % (ref 34.0–46.6)
Hemoglobin: 12.7 g/dL (ref 11.1–15.9)
Immature Grans (Abs): 0.2 10*3/uL — ABNORMAL HIGH (ref 0.0–0.1)
Immature Granulocytes: 2 %
Lymphocytes Absolute: 1.5 10*3/uL (ref 0.7–3.1)
Lymphs: 18 %
MCH: 30.6 pg (ref 26.6–33.0)
MCHC: 33.4 g/dL (ref 31.5–35.7)
MCV: 92 fL (ref 79–97)
Monocytes Absolute: 0.7 10*3/uL (ref 0.1–0.9)
Monocytes: 8 %
Neutrophils Absolute: 5.6 10*3/uL (ref 1.4–7.0)
Neutrophils: 70 %
Platelets: 246 10*3/uL (ref 150–450)
RBC: 4.15 x10E6/uL (ref 3.77–5.28)
RDW: 12.7 % (ref 11.7–15.4)
RPR Ser Ql: NONREACTIVE
WBC: 8.1 10*3/uL (ref 3.4–10.8)

## 2019-05-08 ENCOUNTER — Encounter: Payer: 59 | Admitting: Obstetrics and Gynecology

## 2019-05-09 ENCOUNTER — Other Ambulatory Visit: Payer: Self-pay

## 2019-05-09 ENCOUNTER — Encounter: Payer: Self-pay | Admitting: Certified Nurse Midwife

## 2019-05-09 ENCOUNTER — Ambulatory Visit (INDEPENDENT_AMBULATORY_CARE_PROVIDER_SITE_OTHER): Payer: 59 | Admitting: Certified Nurse Midwife

## 2019-05-09 VITALS — BP 122/74 | Wt 191.0 lb

## 2019-05-09 DIAGNOSIS — Z98891 History of uterine scar from previous surgery: Secondary | ICD-10-CM

## 2019-05-09 DIAGNOSIS — Z3483 Encounter for supervision of other normal pregnancy, third trimester: Secondary | ICD-10-CM

## 2019-05-09 DIAGNOSIS — Z3A29 29 weeks gestation of pregnancy: Secondary | ICD-10-CM

## 2019-05-09 NOTE — Progress Notes (Signed)
No vb. No lof.  

## 2019-05-09 NOTE — Progress Notes (Signed)
ROB at 29wk3d: Doing well. Baby active. No vaginal bleeding or leakage of water. DIscussed use of FLonase and antihistimines in treatment of allergy symptoms. Wants TOLAC with a spontaneous labor. Does not want to have IOL. Discussed last labor: elective IOL at 39 weeks and delivering 3 days later via Cesarean section for arrest of labor at 9.5cm Recommended scheduling a Cesarean section at 39-41 weeks (her choice) while awaiting spontaneous labor She wants to consider this option and talk to her husband.  ROB in 2 weeks TDAP at [facility]  Farrel Conners, CNM

## 2019-05-10 ENCOUNTER — Encounter: Payer: Self-pay | Admitting: Family Medicine

## 2019-05-10 ENCOUNTER — Telehealth (INDEPENDENT_AMBULATORY_CARE_PROVIDER_SITE_OTHER): Payer: 59 | Admitting: Family Medicine

## 2019-05-10 VITALS — Temp 98.0°F | Wt 181.0 lb

## 2019-05-10 DIAGNOSIS — J0140 Acute pansinusitis, unspecified: Secondary | ICD-10-CM | POA: Diagnosis not present

## 2019-05-10 MED ORDER — AMOXICILLIN 875 MG PO TABS: 875.0000 mg | ORAL_TABLET | Freq: Two times a day (BID) | ORAL | 0 refills | Status: AC

## 2019-05-10 NOTE — Progress Notes (Signed)
I connected with Stacey Curtis on 05/10/19 at 12:00 PM EDT by video and verified that I am speaking with the correct person using two identifiers.   I discussed the limitations, risks, security and privacy concerns of performing an evaluation and management service by video and the availability of in person appointments. I also discussed with the patient that there may be a patient responsible charge related to this service. The patient expressed understanding and agreed to proceed.  Patient location: work Provider Location: [facility] Participants: Lynnda Child and Stacey Curtis   Subjective:     Stacey Curtis is a 29 y.o. female presenting for Cough (sx started on 05/03/19. Post nasal drip, ear pain, dark green phlegm, voice change. Went to urgent care on 05/03/19-tested neg for COVID and Flu test. Feels worse. 100.1 temp on 05/05/19)     Cough This is a new problem. The current episode started in the past 7 days. The cough is productive of purulent sputum. Associated symptoms include ear pain, a fever, myalgias (improving) and postnasal drip. Pertinent negatives include no chest pain, chills or shortness of breath. Treatments tried: flonase, claritin.   Saturday fever - went to urgent care, tested for covid/flu > negative Monday/Tuesday - symptoms getting worse, clear to green congestion  Currently pregnant  Review of Systems  Constitutional: Positive for fever. Negative for chills.  HENT: Positive for congestion, ear pain, postnasal drip, sinus pressure and voice change. Negative for dental problem.   Respiratory: Positive for cough. Negative for shortness of breath.   Cardiovascular: Negative for chest pain.  Musculoskeletal: Positive for myalgias (improving).     Social History   Tobacco Use  Smoking Status Never Smoker  Smokeless Tobacco Never Used        Objective:   BP Readings from Last 3 Encounters:  05/09/19 122/74    04/24/19 118/74  03/27/19 114/60   Wt Readings from Last 3 Encounters:  05/10/19 181 lb (82.1 kg)  05/09/19 191 lb (86.6 kg)  04/24/19 185 lb (83.9 kg)    Temp 98 F (36.7 C) Comment: per patient  Wt 181 lb (82.1 kg) Comment: per patient  LMP 10/15/2018   BMI 30.12 kg/m   Physical Exam Constitutional:      Appearance: Normal appearance. She is not ill-appearing.  HENT:     Head: Normocephalic and atraumatic.     Right Ear: External ear normal.     Left Ear: External ear normal.     Mouth/Throat:     Comments: Hoarse voice Eyes:     Conjunctiva/sclera: Conjunctivae normal.  Pulmonary:     Effort: Pulmonary effort is normal. No respiratory distress.  Neurological:     Mental Status: She is alert. Mental status is at baseline.  Psychiatric:        Mood and Affect: Mood normal.        Behavior: Behavior normal.        Thought Content: Thought content normal.        Judgment: Judgment normal.          Assessment & Plan:   Problem List Items Addressed This Visit    None    Visit Diagnoses    Acute non-recurrent pansinusitis    -  Primary   Relevant Medications   amoxicillin (AMOXIL) 875 MG tablet     Neg covid/flu Suspect possible sinusitis Due to pregnancy, cannot use augmentin will try amoxicillin previously tolerated   Return if  symptoms worsen or fail to improve.  Lesleigh Noe, MD

## 2019-05-15 ENCOUNTER — Ambulatory Visit (INDEPENDENT_AMBULATORY_CARE_PROVIDER_SITE_OTHER): Payer: 59 | Admitting: Certified Nurse Midwife

## 2019-05-15 ENCOUNTER — Other Ambulatory Visit: Payer: Self-pay

## 2019-05-15 VITALS — BP 112/64 | Wt 191.0 lb

## 2019-05-15 DIAGNOSIS — R102 Pelvic and perineal pain: Secondary | ICD-10-CM

## 2019-05-15 DIAGNOSIS — Z3483 Encounter for supervision of other normal pregnancy, third trimester: Secondary | ICD-10-CM

## 2019-05-15 DIAGNOSIS — N949 Unspecified condition associated with female genital organs and menstrual cycle: Secondary | ICD-10-CM

## 2019-05-15 DIAGNOSIS — Z3A30 30 weeks gestation of pregnancy: Secondary | ICD-10-CM

## 2019-05-15 DIAGNOSIS — O26893 Other specified pregnancy related conditions, third trimester: Secondary | ICD-10-CM

## 2019-05-15 LAB — POCT URINALYSIS DIPSTICK OB: Glucose, UA: NEGATIVE

## 2019-05-17 NOTE — Progress Notes (Signed)
Work in today at [employer] with concerns regarding increased pelvic pressure and "feeling like the baby will fall out." She reports that her vulvovaginal area is really sore and that  it almost hurts to sit down. - Denies spotting or leakage of fluid. Denies contractions. Baby active. Has decided to set CS date if she does not go into spontaneous labor.  Exam: Abdomen: tender in lower uterine segment and near CS scar.with Leopold's FHTs WNL  Pelvic: External/BUS: no lesions or discharge or inflammation   Vagina: scant white discharge   Cervix: nullip, no inflammation. ; closed/ long/ OOP/ posterior  A: Pelvic pressure at 30wk2d  Prior CS, desires repeat CS if no spontaneous labor by [facility] (around 30 JUne.)  P: Discussed use of  a maternity support garment to help with pelvic pressure CS scheduled for 1 July with Dr Bonney Aid Follow up as scheduled.   Farrel Conners, CNM

## 2019-05-22 ENCOUNTER — Encounter: Payer: Self-pay | Admitting: Obstetrics & Gynecology

## 2019-05-22 ENCOUNTER — Ambulatory Visit (INDEPENDENT_AMBULATORY_CARE_PROVIDER_SITE_OTHER): Payer: 59 | Admitting: Obstetrics & Gynecology

## 2019-05-22 ENCOUNTER — Other Ambulatory Visit: Payer: Self-pay

## 2019-05-22 VITALS — BP 100/60 | Wt 193.0 lb

## 2019-05-22 DIAGNOSIS — Z3A31 31 weeks gestation of pregnancy: Secondary | ICD-10-CM

## 2019-05-22 DIAGNOSIS — Z98891 History of uterine scar from previous surgery: Secondary | ICD-10-CM

## 2019-05-22 DIAGNOSIS — Z23 Encounter for immunization: Secondary | ICD-10-CM | POA: Diagnosis not present

## 2019-05-22 DIAGNOSIS — Z3483 Encounter for supervision of other normal pregnancy, third trimester: Secondary | ICD-10-CM

## 2019-05-22 LAB — POCT URINALYSIS DIPSTICK OB
Glucose, UA: NEGATIVE
POC,PROTEIN,UA: NEGATIVE

## 2019-05-22 NOTE — Progress Notes (Signed)
  Subjective  Fetal Movement? yes Contractions? no Leaking Fluid? no Vaginal Bleeding? no  Objective  BP 100/60   Wt 193 lb (87.5 kg)   LMP 10/15/2018   BMI 32.12 kg/m  General: NAD Pumonary: no increased work of breathing Abdomen: gravid, non-tender Extremities: no edema Psychiatric: mood appropriate, affect full  Assessment  29 y.o. G2P1001 at [redacted]w[redacted]d by  07/22/2019, by Last Menstrual Period presenting for routine prenatal visit  Plan   Problem List Items Addressed This Visit      Other   Supervision of normal pregnancy   History of cesarean delivery    Other Visit Diagnoses    [redacted] weeks gestation of pregnancy    -  Primary   Relevant Orders   POC Urinalysis Dipstick OB (Completed)   Need for Tdap vaccination       Relevant Orders   POC Urinalysis Dipstick OB (Completed)      pregnancy Problems (from 12/05/18 to present)    Problem Noted Resolved   Supervision of normal pregnancy 12/05/2018 by Oswaldo Conroy, CNM No   Overview Addendum 03/27/2019  3:51 PM by Nadara Mustard, MD    [facility] Prenatal Labs  Dating L=6 Blood type: --/--/A POS (01/27 0146)   Genetic Screen AFP:  neg   NIPS: diploid XY Antibody:NEG (01/27 0146)  Anatomic [place] Normal anatomy, posterior fundal placenta, female gender Rubella:   Immune Varicella:  Immune  GTT     Third trimester:  RPR: Non Reactive (01/27 0101)   Rhogam n/a HBsAg:   negative  TDaP vaccine           Flu Shot:declines HIV: Non Reactive (11/12 0919)   Baby Food         Breast                       GBS:  Contraception         Unsure Pap: 05/05/2017, NILM  CBB  No   CS/VBAC VBAC desired; CS for CPD   Support Person                Plans TOLAC if natural labor prior to 40+weeks, otherwise CS scheduled for 07/26/19  TDaP today  Annamarie Major, MD, [facility], [facility] 05/22/2019  3:01 PM

## 2019-05-22 NOTE — Patient Instructions (Signed)
Td (Tetanus, Diphtheria) Vaccine: What You Need to Know 1. Why get vaccinated? Td vaccine can prevent tetanus and diphtheria. Tetanus enters the body through cuts or wounds. Diphtheria spreads from person to person.  TETANUS (T) causes painful stiffening of the muscles. Tetanus can lead to serious health problems, including being unable to open the mouth, having trouble swallowing and breathing, or death.  DIPHTHERIA (D) can lead to difficulty breathing, heart failure, paralysis, or death. 2. Td vaccine Td is only for children 7 years and older, adolescents, and adults.  Td is usually given as a booster dose every 10 years, but it can also be given earlier after a severe and dirty wound or burn. Another vaccine, called Tdap, that protects against pertussis, also known as "whooping cough," in addition to tetanus and diphtheria, may be used instead of Td.  Td may be given at the same time as other vaccines. 3. Talk with your health care provider Tell your vaccine provider if the person getting the vaccine:  Has had an allergic reaction after a previous dose of any vaccine that protects against tetanus or diphtheria, or has any severe, life-threatening allergies.  Has ever had Guillain-Barr Syndrome (also called GBS).  Has had severe pain or swelling after a previous dose of any vaccine that protects against tetanus or diphtheria. In some cases, your health care provider may decide to postpone Td vaccination to a future visit.  People with minor illnesses, such as a cold, may be vaccinated. People who are moderately or severely ill should usually wait until they recover before getting Td vaccine.  Your health care provider can give you more information. 4. Risks of a vaccine reaction  Pain, redness, or swelling where the shot was given, mild fever, headache, feeling tired, and nausea, vomiting, diarrhea, or stomachache sometimes happen after Td vaccine. People sometimes faint after medical  procedures, including vaccination. Tell your provider if you feel dizzy or have vision changes or ringing in the ears.  As with any medicine, there is a very remote chance of a vaccine causing a severe allergic reaction, other serious injury, or death. 5. What if there is a serious problem? An allergic reaction could occur after the vaccinated person leaves the clinic. If you see signs of a severe allergic reaction (hives, swelling of the face and throat, difficulty breathing, a fast heartbeat, dizziness, or weakness), call 9-1-1 and get the person to the nearest hospital.  For other signs that concern you, call your health care provider.  Adverse reactions should be reported to the Vaccine Adverse Event Reporting System (VAERS). Your health care provider will usually file this report, or you can do it yourself. Visit the VAERS website at www.vaers.hhs.gov or call 1-800-822-7967. VAERS is only for reporting reactions, and VAERS staff do not give medical advice. 6. The National Vaccine Injury Compensation Program The National Vaccine Injury Compensation Program (VICP) is a federal program that was created to compensate people who may have been injured by certain vaccines. Visit the VICP website at www.hrsa.gov/vaccinecompensation or call 1-800-338-2382 to learn about the program and about filing a claim. There is a time limit to file a claim for compensation. 7. How can I learn more?  Ask your health care provider.  Call your local or state health department.  Contact the Centers for Disease Control and Prevention (CDC): ? Call 1-800-232-4636 (1-800-CDC-INFO) or ? Visit CDC's website at www.cdc.gov/vaccines Vaccine Information Statement Td Vaccine (04/26/18) This information is not intended to replace advice given   to you by your health care provider. Make sure you discuss any questions you have with your health care provider. Document Revised: 06/05/2018 Document Reviewed: 05/08/2018 Elsevier  Patient Education  2020 [employer]vier Inc.  

## 2019-06-05 ENCOUNTER — Ambulatory Visit (INDEPENDENT_AMBULATORY_CARE_PROVIDER_SITE_OTHER): Payer: 59 | Admitting: Obstetrics

## 2019-06-05 ENCOUNTER — Other Ambulatory Visit: Payer: Self-pay

## 2019-06-05 ENCOUNTER — Encounter: Payer: Self-pay | Admitting: Obstetrics

## 2019-06-05 VITALS — BP 118/74 | Wt 196.0 lb

## 2019-06-05 DIAGNOSIS — Z3483 Encounter for supervision of other normal pregnancy, third trimester: Secondary | ICD-10-CM

## 2019-06-05 DIAGNOSIS — Z3A33 33 weeks gestation of pregnancy: Secondary | ICD-10-CM

## 2019-06-05 NOTE — Patient Instructions (Signed)
Vaginal Birth After Cesarean Delivery  Vaginal birth after cesarean delivery (VBAC) is giving birth vaginally after previously delivering a baby through a cesarean section (C-section). A VBAC may be a safe option for you, depending on your health and other factors. It is important to discuss VBAC with your health care provider early in your pregnancy so you can understand the risks, benefits, and options. Having these discussions early will give you time to make your birth plan. Who are the best candidates for VBAC? The best candidates for VBAC are women who:  Have had one or two prior cesarean deliveries, and the incision made during the delivery was horizontal (low transverse).  Do not have a vertical (classical) scar on their uterus.  Have not had a tear in the wall of their uterus (uterine rupture).  Plan to have more pregnancies. A VBAC is also more likely to be successful:  In women who have previously given birth vaginally.  When labor starts by itself (spontaneously) before the due date. What are the benefits of VBAC? The benefits of delivering your baby vaginally instead of by a cesarean delivery include:  A shorter hospital stay.  A faster recovery time.  Less pain.  Avoiding risks associated with major surgery, such as infection and blood clots.  Less blood loss and less need for donated blood (transfusions). What are the risks of VBAC? The main risk of attempting a VBAC is that it may fail, forcing your health care provider to deliver your baby by a C-section. Other risks are rare and include:  Tearing (rupture) of the scar from a past cesarean delivery.  Other risks associated with vaginal deliveries. If a repeat cesarean delivery is needed, the risks include:  Blood loss.  Infection.  Blood clot.  Damage to surrounding organs.  Removal of the uterus (hysterectomy), if it is damaged.  Placenta problems in future pregnancies. What else should I know  about my options? Delivering a baby through a VBAC is similar to having a normal spontaneous vaginal delivery. Therefore, it is safe:  To try with twins.  For your health care provider to try to turn the baby from a breech position (external cephalic version) during labor.  With epidural analgesia for pain relief. Consider where you would like to deliver your baby. VBAC should be attempted in facilities where an emergency cesarean delivery can be performed. VBAC is not recommended for home births. Any changes in your health or your baby's health during your pregnancy may make it necessary to change your initial decision about VBAC. Your health care provider may recommend that you do not attempt a VBAC if:  Your baby's suspected weight is 8.8 lb (4 kg) or more.  You have preeclampsia. This is a condition that causes high blood pressure along with other symptoms, such as swelling and headaches.  You will have VBAC less than 19 months after your cesarean delivery.  You are past your due date.  You need to have labor started (induced) because your cervix is not ready for labor (unfavorable). Where to find more information  American Pregnancy Association: americanpregnancy.org  American Congress of Obstetricians and Gynecologists: acog.org Summary  Vaginal birth after cesarean delivery (VBAC) is giving birth vaginally after previously delivering a baby through a cesarean section (C-section). A VBAC may be a safe option for you, depending on your health and other factors.  Discuss VBAC with your health care provider early in your pregnancy so you can understand the risks, benefits, options, and   have plenty of time to make your birth plan.  The main risk of attempting a VBAC is that it may fail, forcing your health care provider to deliver your baby by a C-section. Other risks are rare. This information is not intended to replace advice given to you by your health care provider. Make sure  you discuss any questions you have with your health care provider. Document Revised: 05/09/2018 Document Reviewed: 04/20/2016 Elsevier Patient Education  2020 Elsevier Inc.  

## 2019-06-05 NOTE — Progress Notes (Signed)
No vb. No lof.  

## 2019-06-05 NOTE — Progress Notes (Signed)
G2 P1 at 33weeks 2 days. Denies any dangr sxs. Doing well. Some increased pressure; denies contractions. Ample fetal movement, She has strong memories of her first labor and CS which continue to upset her. Would like to labor spontaneously- but also has a repeat CS set for 40 + weeks GA. O: see vital signs A: IUP 33+ weeks .     Size=Dates P: discussed end of pregnancy discomfort and ways to address. Spent extra time addressing her first IOL and labor experience.  RTC in 2 weeks  Will plan on GBs testing at 36 weeks.

## 2019-06-06 ENCOUNTER — Encounter: Payer: 59 | Admitting: Certified Nurse Midwife

## 2019-06-12 ENCOUNTER — Telehealth (INDEPENDENT_AMBULATORY_CARE_PROVIDER_SITE_OTHER): Payer: 59 | Admitting: Family Medicine

## 2019-06-12 ENCOUNTER — Encounter: Payer: Self-pay | Admitting: Family Medicine

## 2019-06-12 ENCOUNTER — Other Ambulatory Visit: Payer: Self-pay

## 2019-06-12 VITALS — Ht 65.0 in

## 2019-06-12 DIAGNOSIS — H04203 Unspecified epiphora, bilateral lacrimal glands: Secondary | ICD-10-CM

## 2019-06-12 MED ORDER — OLOPATADINE HCL 0.1 % OP SOLN: 1.0000 [drp] | Freq: Two times a day (BID) | OPHTHALMIC | 0 refills | Status: DC

## 2019-06-12 NOTE — Progress Notes (Signed)
VIRTUAL VISIT Due to national recommendations of social distancing due to COVID 19, a virtual visit is felt to be most appropriate for this patient at this time.   I connected with the patient on 06/12/19 at 11:00 AM EDT by virtual telehealth platform and verified that I am speaking with the correct person using two identifiers.   I discussed the limitations, risks, security and privacy concerns of performing an evaluation and management service by  virtual telehealth platform and the availability of in person appointments. I also discussed with the patient that there may be a patient responsible charge related to this service. The patient expressed understanding and agreed to proceed.  Patient location: Home Provider Location:  [facility] Participants: Kerby Nora and Arlana Pouch   Chief Complaint  Patient presents with  . Watery Eye    Right-Currently pregnant    History of Present Illness:   29 year old  [redacted] weeks pregnant female presents with new onset watery eyes in last 1.5 weeks.   She has noted right eye watering , constantly. No foreign body sensation, not itchy, not red.  Mildly sore under eye due to watery eye.  No fever, no face pain.  No eye redness. No change in vision. She does not wear contacts.    She feels fine otherwise.  She has been using claritin, flonase.. she has noted no improvement.  She has flushed it with saline.  She has tried not wearing makeup.   She went to optometrist.. got new glasses, eyes looked good.  Took antihistmine eye drops... this has not helped.  COVID 19 screen No recent travel or known exposure to COVID19 The patient denies respiratory symptoms of COVID 19 at this time.  The importance of social distancing was discussed today.   Review of Systems  Constitutional: Negative for chills and fever.  HENT: Negative for congestion and ear pain.   Eyes: Positive for discharge. Negative for blurred vision, double  vision, photophobia, pain and redness.  Respiratory: Negative for cough and shortness of breath.   Cardiovascular: Negative for chest pain, palpitations and leg swelling.  Gastrointestinal: Negative for abdominal pain, blood in stool, constipation, diarrhea, nausea and vomiting.  Genitourinary: Negative for dysuria.  Musculoskeletal: Negative for falls and myalgias.  Skin: Negative for rash.  Neurological: Negative for dizziness.  Psychiatric/Behavioral: Negative for depression. The patient is not nervous/anxious.       Past Medical History:  Diagnosis Date  . Anxiety and depression   . Common migraine   . Nephrolithiasis   . 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 Recurrent cystitis     reports that she has never smoked. She has never used smokeless tobacco. She reports current alcohol use. She reports that she does not use drugs.   Current Outpatient Medications:  .  Prenatal Vit-Fe Fumarate-FA (PRENATAL VITAMIN PO), Take by mouth., Disp: , Rfl:    Observations/Objective: Height 5\' 5"  (1.651 m), last menstrual period 10/15/2018, currently breastfeeding.  Physical Exam  Physical Exam Constitutional:      General: The patient is not in acute distress. EYE: no redness, clear watering, no pus, no erythema around eye, EOMI, pupil appear equal and reactive Pulmonary:     Effort: Pulmonary effort is normal. No respiratory distress.  Neurological:     Mental Status: The patient is alert and oriented to person, place, and time.  Psychiatric:        Mood  and Affect: Mood normal.        Behavior: Behavior normal.   Assessment and Plan Watery eyes Allergic symptoms vs rhinitis of pregnancy.  Treat with nasal saline irrigaiton daily and Brehe right strips.  If not improving can try trial of pataday for possible allergic conjunctivitis... trial of 3 days, if not improving stop.Pregnancy Category C   If pregnancy rhinitis.. may  resolve 2 weeks after pregnancy.     I discussed the assessment and treatment plan with the patient. The patient was provided an opportunity to ask questions and all were answered. The patient agreed with the plan and demonstrated an understanding of the instructions.   The patient was advised to call back or seek an in-person evaluation if the symptoms worsen or if the condition fails to improve as anticipated.     Eliezer Lofts, MD

## 2019-06-12 NOTE — Assessment & Plan Note (Signed)
Allergic symptoms vs rhinitis of pregnancy.  Treat with nasal saline irrigaiton daily and Brehe right strips.  If not improving can try trial of pataday for possible allergic conjunctivitis... trial of 3 days, if not improving stop.Pregnancy Category C   If pregnancy rhinitis.. may resolve 2 weeks after pregnancy.

## 2019-06-20 ENCOUNTER — Other Ambulatory Visit: Payer: Self-pay

## 2019-06-20 ENCOUNTER — Ambulatory Visit (INDEPENDENT_AMBULATORY_CARE_PROVIDER_SITE_OTHER): Payer: 59 | Admitting: Obstetrics

## 2019-06-20 VITALS — BP 100/60 | Wt 198.0 lb

## 2019-06-20 DIAGNOSIS — Z3A35 35 weeks gestation of pregnancy: Secondary | ICD-10-CM

## 2019-06-20 DIAGNOSIS — Z3483 Encounter for supervision of other normal pregnancy, third trimester: Secondary | ICD-10-CM

## 2019-06-20 LAB — POCT URINALYSIS DIPSTICK OB
Glucose, UA: NEGATIVE
POC,PROTEIN,UA: NEGATIVE

## 2019-06-20 NOTE — Progress Notes (Signed)
  Routine Prenatal Care Visit  Subjective  Stacey Curtis is a 29 y.o. G2P1001 at [redacted]w[redacted]d being seen today for ongoing prenatal care.  She is currently monitored for the following issues for this low-risk pregnancy and has COMMON MIGRAINE; FIBROCYSTIC BREAST DISEASE, MINIMAL; Disturbed concentration; Anxiety and depression; Recurrent cystitis; Supervision of normal pregnancy; History of cesarean delivery; Short interval between pregnancies affecting pregnancy, antepartum; and Watery eyes on their problem list.  ----------------------------------------------------------------------------------- Patient reports increased pelvic pressure.   Contractions: Not present. Vag. Bleeding: None.  Movement: Present. Leaking Fluid denies.  ----------------------------------------------------------------------------------- The following portions of the patient's history were reviewed and updated as appropriate: allergies, current medications, past family history, past medical history, past social history, past surgical history and problem list. Problem list updated.  Objective  Blood pressure 100/60, weight 198 lb (89.8 kg), last menstrual period 10/15/2018, currently breastfeeding. Pregravid weight 185 lb (83.9 kg) Total Weight Gain 13 lb (5.897 kg) Urinalysis: Urine Protein Negative  Urine Glucose Negative  Fetal Status:     Movement: Present     General:  Alert, oriented and cooperative. Patient is in no acute distress.  Skin: Skin is warm and dry. No rash noted.   Cardiovascular: Normal heart rate noted  Respiratory: Normal respiratory effort, no problems with respiration noted  Abdomen: Soft, gravid, appropriate for gestational age. Pain/Pressure: Present     Pelvic:  Cervical exam deferred        Extremities: Normal range of motion.     Mental Status: Normal mood and affect. Normal behavior. Normal judgment and thought content.   Assessment   29 y.o. G2P1001 at [redacted]w[redacted]d by  07/22/2019, by  Last Menstrual Period presenting for routine prenatal visit  Plan   pregnancy Problems (from 12/05/18 to present)    Problem Noted Resolved   Supervision of normal pregnancy 12/05/2018 by Oswaldo Conroy, CNM No   Overview Addendum 03/27/2019  3:51 PM by Nadara Mustard, MD    [facility] Prenatal Labs  Dating L=6 Blood type: --/--/A POS (01/27 0146)   Genetic Screen AFP:  neg   NIPS: diploid XY Antibody:NEG (01/27 0146)  Anatomic [place] Normal anatomy, posterior fundal placenta, female gender Rubella:   Immune Varicella:  Immune  GTT     Third trimester:  RPR: Non Reactive (01/27 0101)   Rhogam n/a HBsAg:   negative  TDaP vaccine           Flu Shot:declines HIV: Non Reactive (11/12 0919)   Baby Food         Breast                       GBS:  Contraception         Unsure Pap: 05/05/2017, NILM  CBB  No   CS/VBAC VBAC desired; CS for CPD   Support Person                  Term labor symptoms and general obstetric precautions including but not limited to vaginal bleeding, contractions, leaking of fluid and fetal movement were reviewed in detail with the patient. Please refer to After Visit Summary for other counseling recommendations.  She would like to start taking daily evenig Primrose Oil for natural cervical ripening. Aware there is not ample research supporting its effectiveness.  Return in about 2 weeks (around 06/27/2019) for return OB and GBS testing.  Mirna Mires, CNM  06/20/2019 2:43 PM

## 2019-06-22 ENCOUNTER — Telehealth: Payer: Self-pay

## 2019-06-22 NOTE — Telephone Encounter (Signed)
Spoke w/patient. She denies any ctx, headache, blurred vision, swelling, leaking of fluid. She reports she is well hydrated, but has been on her feet a lot. In general she feels kinda (blah/flu like/early pregnancy nausea). No known exposure to anyone w/Covid. States the nausea lasts a few hours, then while go away (comes in waves). She does not have any nausea meds on hand to try as she didn't experience nausea in early pregnancy this time. Advised on labor precautions/when to report to ED>L&D for eval. States her next apt is 6/9 and would like to be seen next week now since she's feeling like this. Apt scheduled 06/27/19 @8 :10 w/Schuman. Pt will be [redacted]w[redacted]d & will need GBS/aptima at this visit.

## 2019-06-22 NOTE — Telephone Encounter (Signed)
Patient saw Stacey Curtis 5/26 and felt fine, but did mention she has been feeling a little dizzy and nauseated at times. Today she has been feeling extremely nauseas & dizzy. She thinks she lost her mucus plug (which she knows is normal). Inquiring if the dizzy nausea is normal. She didn't experience this with her first pregnancy. [id number]#[phone]

## 2019-06-22 NOTE — Telephone Encounter (Signed)
Spoke with patient by phone this afternoon. She has been experiencing some nausea and dizziness. She denies any contractions, denies LOF or vaginal bleeding. Has been very busy working at home. Her baby is moving well. Advised her to slow down and rest, sip on Gatorade or Sprite. She has an appointment for next week. She does know that she can call the provider on call over the weekend if needed.

## 2019-06-27 ENCOUNTER — Other Ambulatory Visit (HOSPITAL_COMMUNITY)
Admission: RE | Admit: 2019-06-27 | Discharge: 2019-06-27 | Disposition: A | Discharge status: Home / Self Care | Payer: 59 | Source: Ambulatory Visit | Attending: Obstetrics and Gynecology | Admitting: Obstetrics and Gynecology

## 2019-06-27 ENCOUNTER — Ambulatory Visit (INDEPENDENT_AMBULATORY_CARE_PROVIDER_SITE_OTHER): Payer: 59 | Admitting: Obstetrics and Gynecology

## 2019-06-27 ENCOUNTER — Other Ambulatory Visit: Payer: Self-pay

## 2019-06-27 ENCOUNTER — Encounter: Payer: Self-pay | Admitting: Obstetrics and Gynecology

## 2019-06-27 VITALS — BP 114/72 | Ht 65.0 in | Wt 198.6 lb

## 2019-06-27 DIAGNOSIS — Z3483 Encounter for supervision of other normal pregnancy, third trimester: Secondary | ICD-10-CM | POA: Diagnosis present

## 2019-06-27 DIAGNOSIS — Z3A36 36 weeks gestation of pregnancy: Secondary | ICD-10-CM | POA: Insufficient documentation

## 2019-06-27 DIAGNOSIS — O09893 Supervision of other high risk pregnancies, third trimester: Secondary | ICD-10-CM

## 2019-06-27 DIAGNOSIS — O26843 Uterine size-date discrepancy, third trimester: Secondary | ICD-10-CM

## 2019-06-27 DIAGNOSIS — Z98891 History of uterine scar from previous surgery: Secondary | ICD-10-CM

## 2019-06-27 DIAGNOSIS — O09899 Supervision of other high risk pregnancies, unspecified trimester: Secondary | ICD-10-CM

## 2019-06-27 LAB — POCT URINALYSIS DIPSTICK OB
Glucose, UA: NEGATIVE
POC,PROTEIN,UA: NEGATIVE

## 2019-06-27 NOTE — Patient Instructions (Signed)
Third Trimester of Pregnancy The third trimester is from week 28 through week 40 (months 7 through 9). The third trimester is a time when the unborn baby (fetus) is growing rapidly. At the end of the ninth month, the fetus is about 20 inches in length and weighs 6-10 pounds. Body changes during your third trimester Your body will continue to go through many changes during pregnancy. The changes vary from woman to woman. During the third trimester:  Your weight will continue to increase. You can expect to gain 25-35 pounds (11-16 kg) by the end of the pregnancy.  You may begin to get stretch marks on your hips, abdomen, and breasts.  You may urinate more often because the fetus is moving lower into your pelvis and pressing on your bladder.  You may develop or continue to have heartburn. This is caused by increased hormones that slow down muscles in the digestive tract.  You may develop or continue to have constipation because increased hormones slow digestion and cause the muscles that push waste through your intestines to relax.  You may develop hemorrhoids. These are swollen veins (varicose veins) in the rectum that can itch or be painful.  You may develop swollen, bulging veins (varicose veins) in your legs.  You may have increased body aches in the pelvis, back, or thighs. This is due to weight gain and increased hormones that are relaxing your joints.  You may have changes in your hair. These can include thickening of your hair, rapid growth, and changes in texture. Some women also have hair loss during or after pregnancy, or hair that feels dry or thin. Your hair will most likely return to normal after your baby is born.  Your breasts will continue to grow and they will continue to become tender. A yellow fluid (colostrum) may leak from your breasts. This is the first milk you are producing for your baby.  Your belly button may stick out.  You may notice more swelling in your hands,  face, or ankles.  You may have increased tingling or numbness in your hands, arms, and legs. The skin on your belly may also feel numb.  You may feel short of breath because of your expanding uterus.  You may have more problems sleeping. This can be caused by the size of your belly, increased need to urinate, and an increase in your body's metabolism.  You may notice the fetus "dropping," or moving lower in your abdomen (lightening).  You may have increased vaginal discharge.  You may notice your joints feel loose and you may have pain around your pelvic bone. What to expect at prenatal visits You will have prenatal exams every 2 weeks until week 36. Then you will have weekly prenatal exams. During a routine prenatal visit:  You will be weighed to make sure you and the baby are growing normally.  Your blood pressure will be taken.  Your abdomen will be measured to track your baby's growth.  The fetal heartbeat will be listened to.  Any test results from the previous visit will be discussed.  You may have a cervical check near your due date to see if your cervix has softened or thinned (effaced).  You will be tested for Group B streptococcus. This happens between 35 and 37 weeks. Your health care provider may ask you:  What your birth plan is.  How you are feeling.  If you are feeling the baby move.  If you have had any abnormal   symptoms, such as leaking fluid, bleeding, severe headaches, or abdominal cramping.  If you are using any tobacco products, including cigarettes, chewing tobacco, and electronic cigarettes.  If you have any questions. Other tests or screenings that may be performed during your third trimester include:  Blood tests that check for low iron levels (anemia).  Fetal testing to check the health, activity level, and growth of the fetus. Testing is done if you have certain medical conditions or if there are problems during the pregnancy.  Nonstress test  (NST). This test checks the health of your baby to make sure there are no signs of problems, such as the baby not getting enough oxygen. During this test, a belt is placed around your belly. The baby is made to move, and its heart rate is monitored during movement. What is false labor? False labor is a condition in which you feel small, irregular tightenings of the muscles in the womb (contractions) that usually go away with rest, changing position, or drinking water. These are called Braxton Hicks contractions. Contractions may last for hours, days, or even weeks before true labor sets in. If contractions come at regular intervals, become more frequent, increase in intensity, or become painful, you should see your health care provider. What are the signs of labor?  Abdominal cramps.  Regular contractions that start at 10 minutes apart and become stronger and more frequent with time.  Contractions that start on the top of the uterus and spread down to the lower abdomen and back.  Increased pelvic pressure and dull back pain.  A watery or bloody mucus discharge that comes from the vagina.  Leaking of amniotic fluid. This is also known as your "water breaking." It could be a slow trickle or a gush. Let your health care provider know if it has a color or strange odor. If you have any of these signs, call your health care provider right away, even if it is before your due date. Follow these instructions at home: Medicines  Follow your health care provider's instructions regarding medicine use. Specific medicines may be either safe or unsafe to take during pregnancy.  Take a prenatal vitamin that contains at least 600 micrograms (mcg) of folic acid.  If you develop constipation, try taking a stool softener if your health care provider approves. Eating and drinking   Eat a balanced diet that includes fresh fruits and vegetables, whole grains, good sources of protein such as meat, eggs, or tofu,  and low-fat dairy. Your health care provider will help you determine the amount of weight gain that is right for you.  Avoid raw meat and uncooked cheese. These carry germs that can cause birth defects in the baby.  If you have low calcium intake from food, talk to your health care provider about whether you should take a daily calcium supplement.  Eat four or five small meals rather than three large meals a day.  Limit foods that are high in fat and processed sugars, such as fried and sweet foods.  To prevent constipation: ? Drink enough fluid to keep your urine clear or pale yellow. ? Eat foods that are high in fiber, such as fresh fruits and vegetables, whole grains, and beans. Activity  Exercise only as directed by your health care provider. Most women can continue their usual exercise routine during pregnancy. Try to exercise for 30 minutes at least 5 days a week. Stop exercising if you experience uterine contractions.  Avoid heavy lifting.  Do   not exercise in extreme heat or humidity, or at high altitudes.  Wear low-heel, comfortable shoes.  Practice good posture.  You may continue to have sex unless your health care provider tells you otherwise. Relieving pain and discomfort  Take frequent breaks and rest with your legs elevated if you have leg cramps or low back pain.  Take warm sitz baths to soothe any pain or discomfort caused by hemorrhoids. Use hemorrhoid cream if your health care provider approves.  Wear a good support bra to prevent discomfort from breast tenderness.  If you develop varicose veins: ? Wear support pantyhose or compression stockings as told by your healthcare provider. ? Elevate your feet for 15 minutes, 3-4 times a day. Prenatal care  Write down your questions. Take them to your prenatal visits.  Keep all your prenatal visits as told by your health care provider. This is important. Safety  Wear your seat belt at all times when driving.  Make  a list of emergency phone numbers, including numbers for family, friends, the hospital, and police and fire departments. General instructions  Avoid cat litter boxes and soil used by cats. These carry germs that can cause birth defects in the baby. If you have a cat, ask someone to clean the litter box for you.  Do not travel far distances unless it is absolutely necessary and only with the approval of your health care provider.  Do not use hot tubs, steam rooms, or saunas.  Do not drink alcohol.  Do not use any products that contain nicotine or tobacco, such as cigarettes and e-cigarettes. If you need help quitting, ask your health care provider.  Do not use any medicinal herbs or unprescribed drugs. These chemicals affect the formation and growth of the baby.  Do not douche or use tampons or scented sanitary pads.  Do not cross your legs for long periods of time.  To prepare for the arrival of your baby: ? Take prenatal classes to understand, practice, and ask questions about labor and delivery. ? Make a trial run to the hospital. ? Visit the hospital and tour the maternity area. ? Arrange for maternity or paternity leave through employers. ? Arrange for family and friends to take care of pets while you are in the hospital. ? Purchase a rear-facing car seat and make sure you know how to install it in your car. ? Pack your hospital bag. ? Prepare the baby's nursery. Make sure to remove all pillows and stuffed animals from the baby's crib to prevent suffocation.  Visit your dentist if you have not gone during your pregnancy. Use a soft toothbrush to brush your teeth and be gentle when you floss. Contact a health care provider if:  You are unsure if you are in labor or if your water has broken.  You become dizzy.  You have mild pelvic cramps, pelvic pressure, or nagging pain in your abdominal area.  You have lower back pain.  You have persistent nausea, vomiting, or  diarrhea.  You have an unusual or bad smelling vaginal discharge.  You have pain when you urinate. Get help right away if:  Your water breaks before 37 weeks.  You have regular contractions less than 5 minutes apart before 37 weeks.  You have a fever.  You are leaking fluid from your vagina.  You have spotting or bleeding from your vagina.  You have severe abdominal pain or cramping.  You have rapid weight loss or weight gain.  You have   shortness of breath with chest pain.  You notice sudden or extreme swelling of your face, hands, ankles, feet, or legs.  Your baby makes fewer than 10 movements in 2 hours.  You have severe headaches that do not go away when you take medicine.  You have vision changes. Summary  The third trimester is from week 28 through week 40, months 7 through 9. The third trimester is a time when the unborn baby (fetus) is growing rapidly.  During the third trimester, your discomfort may increase as you and your baby continue to gain weight. You may have abdominal, leg, and back pain, sleeping problems, and an increased need to urinate.  During the third trimester your breasts will keep growing and they will continue to become tender. A yellow fluid (colostrum) may leak from your breasts. This is the first milk you are producing for your baby.  False labor is a condition in which you feel small, irregular tightenings of the muscles in the womb (contractions) that eventually go away. These are called Braxton Hicks contractions. Contractions may last for hours, days, or even weeks before true labor sets in.  Signs of labor can include: abdominal cramps; regular contractions that start at 10 minutes apart and become stronger and more frequent with time; watery or bloody mucus discharge that comes from the vagina; increased pelvic pressure and dull back pain; and leaking of amniotic fluid. This information is not intended to replace advice given to you by your  health care provider. Make sure you discuss any questions you have with your health care provider. Document Revised: 05/04/2018 Document Reviewed: 02/17/2016 Elsevier Patient Education  2020 [employer]  

## 2019-06-27 NOTE — Progress Notes (Signed)
    Routine Prenatal Care Visit  Subjective  Stacey Curtis is a 29 y.o. G2P1001 at [redacted]w[redacted]d being seen today for ongoing prenatal care.  She is currently monitored for the following issues for this low-risk pregnancy and has COMMON MIGRAINE; FIBROCYSTIC BREAST DISEASE, MINIMAL; Disturbed concentration; Anxiety and depression; Recurrent cystitis; Supervision of normal pregnancy; History of cesarean delivery; Short interval between pregnancies affecting pregnancy, antepartum; and Watery eyes on their problem list.  ----------------------------------------------------------------------------------- Patient reports loss of mucus plug last week.   Contractions: Not present. Vag. Bleeding: None.  Movement: Present. Denies leaking of fluid.  ----------------------------------------------------------------------------------- The following portions of the patient's history were reviewed and updated as appropriate: allergies, current medications, past family history, past medical history, past social history, past surgical history and problem list. Problem list updated.   Objective  Blood pressure 114/72, height 5\' 5"  (1.651 m), weight 198 lb 9.6 oz (90.1 kg), last menstrual period 10/15/2018, currently breastfeeding. Pregravid weight 185 lb (83.9 kg) Total Weight Gain 13 lb 9.6 oz (6.169 kg) Urinalysis:      Fetal Status: Fetal Heart Rate (bpm): 150 Fundal Height: 33 cm Movement: Present     General:  Alert, oriented and cooperative. Patient is in no acute distress.  Skin: Skin is warm and dry. No rash noted.   Cardiovascular: Normal heart rate noted  Respiratory: Normal respiratory effort, no problems with respiration noted  Abdomen: Soft, gravid, appropriate for gestational age. Pain/Pressure: Absent     Pelvic:  Cervical exam performed        Extremities: Normal range of motion.  Edema: None  Mental Status: Normal mood and affect. Normal behavior. Normal judgment and thought content.      Assessment   29 y.o. G2P1001 at [redacted]w[redacted]d by  07/22/2019, by Last Menstrual Period presenting for routine prenatal visit  Plan   pregnancy Problems (from 12/05/18 to present)    Problem Noted Resolved   Supervision of normal pregnancy 12/05/2018 by 13/10/2018, CNM No   Overview Addendum 03/27/2019  3:51 PM by 05/27/2019, MD    [facility] Prenatal Labs  Dating L=6 Blood type: --/--/A POS (01/27 0146)   Genetic Screen AFP:  neg   NIPS: diploid XY Antibody:NEG (01/27 0146)  Anatomic 09-03-2001 Normal anatomy, posterior fundal placenta, female gender Rubella:   Immune Varicella:  Immune  GTT     Third trimester:  RPR: Non Reactive (01/27 0101)   Rhogam n/a HBsAg:   negative  TDaP vaccine           Flu Shot:declines HIV: Non Reactive (11/12 0919)   Baby Food         Breast                       GBS:  Contraception         Unsure Pap: 05/05/2017, NILM  CBB  No   CS/VBAC VBAC desired; CS for CPD   Support Person                 Growth 07/05/2017 next visit  Gestational age appropriate obstetric precautions including but not limited to vaginal bleeding, contractions, leaking of fluid and fetal movement were reviewed in detail with the patient.    Return in about 1 week (around 07/04/2019) for ROB and growth 09/03/2019.  [place] MD [facility], [facility] 06/27/2019, 8:35 AM

## 2019-06-28 LAB — CERVICOVAGINAL ANCILLARY ONLY
Chlamydia: NEGATIVE
Comment: NEGATIVE
Comment: NORMAL
Neisseria Gonorrhea: NEGATIVE

## 2019-07-02 LAB — CULTURE, BETA STREP (GROUP B ONLY): Strep Gp B Culture: NEGATIVE

## 2019-07-04 ENCOUNTER — Other Ambulatory Visit: Payer: Self-pay

## 2019-07-04 ENCOUNTER — Ambulatory Visit (INDEPENDENT_AMBULATORY_CARE_PROVIDER_SITE_OTHER): Payer: 59 | Admitting: Obstetrics

## 2019-07-04 ENCOUNTER — Ambulatory Visit (INDEPENDENT_AMBULATORY_CARE_PROVIDER_SITE_OTHER): Payer: 59

## 2019-07-04 VITALS — BP 120/80 | Wt 199.0 lb

## 2019-07-04 DIAGNOSIS — Z3480 Encounter for supervision of other normal pregnancy, unspecified trimester: Secondary | ICD-10-CM

## 2019-07-04 DIAGNOSIS — Z3A38 38 weeks gestation of pregnancy: Secondary | ICD-10-CM | POA: Diagnosis not present

## 2019-07-04 DIAGNOSIS — Z3A37 37 weeks gestation of pregnancy: Secondary | ICD-10-CM

## 2019-07-04 DIAGNOSIS — Z3483 Encounter for supervision of other normal pregnancy, third trimester: Secondary | ICD-10-CM

## 2019-07-04 DIAGNOSIS — O26843 Uterine size-date discrepancy, third trimester: Secondary | ICD-10-CM

## 2019-07-04 LAB — POCT URINALYSIS DIPSTICK OB
Glucose, UA: NEGATIVE
POC,PROTEIN,UA: NEGATIVE

## 2019-07-04 NOTE — Progress Notes (Signed)
Routine Prenatal Care Visit  Subjective  Stacey Curtis is a 29 y.o. G2P1001 at [redacted]w[redacted]d being seen today for ongoing prenatal care.  She is currently monitored for the following issues for this high-risk pregnancy and has COMMON MIGRAINE; FIBROCYSTIC BREAST DISEASE, MINIMAL; Disturbed concentration; Anxiety and depression; Recurrent cystitis; Supervision of normal pregnancy; History of cesarean delivery; Short interval between pregnancies affecting pregnancy, antepartum; and Watery eyes on their problem list.  ----------------------------------------------------------------------------------- Patient reports an eagerness to have spontaneous labor and avoid a CS.she is having BH UCs, and the baby moves well. Had a growth scan today. Requesting a cervical check today..   Contractions: Not present. Vag. Bleeding: None.  Movement: Present. Leaking Fluid denies.  ----------------------------------------------------------------------------------- The following portions of the patient's history were reviewed and updated as appropriate: allergies, current medications, past family history, past medical history, past social history, past surgical history and problem list. Problem list updated.  Objective  Blood pressure 120/80, weight 199 lb (90.3 kg), last menstrual period 10/15/2018, currently breastfeeding. Pregravid weight 185 lb (83.9 kg) Total Weight Gain 14 lb (6.35 kg) Urinalysis: Urine Protein    Urine Glucose    Fetal Status:     Movement: Present     General:  Alert, oriented and cooperative. Patient is in no acute distress.  Skin: Skin is warm and dry. No rash noted.   Cardiovascular: Normal heart rate noted  Respiratory: Normal respiratory effort, no problems with respiration noted  Abdomen: Soft, gravid, appropriate for gestational age. Pain/Pressure: Present     Pelvic:  Cervical exam performed      1.5 cms/long/-3  Extremities: Normal range of motion.     Mental Status: Normal  mood and affect. Normal behavior. Normal judgment and thought content.   Assessment   29 y.o. G2P1001 at [redacted]w[redacted]d by  07/22/2019, by Last Menstrual Period presenting for routine prenatal visit  Plan   pregnancy Problems (from 12/05/18 to present)    Problem Noted Resolved   Supervision of normal pregnancy 12/05/2018 by Oswaldo Conroy, CNM No   Overview Addendum 07/04/2019  1:59 PM by Mirna Mires, CNM    [facility] Prenatal Labs  Dating L=6 Blood type: --/--/A POS (01/27 0146)   Genetic Screen AFP:  neg   NIPS: diploid XY Antibody:NEG (01/27 0146)  Anatomic [place] Normal anatomy, posterior fundal placenta, female gender Rubella:   Immune Varicella:  Immune  GTT     Third trimester:  RPR: Non Reactive (01/27 0101)   Rhogam n/a HBsAg:   negative  TDaP vaccine           Flu Shot:declines HIV: Non Reactive (11/12 0919)   Baby Food         Breast                       [id number]  Contraception         Unsure Pap: 05/05/2017, NILM  CBB  No   CS/VBAC VBAC desired; CS for CPD   Support Person                  Term labor symptoms and general obstetric precautions including but not limited to vaginal bleeding, contractions, leaking of fluid and fetal movement were reviewed in detail with the patient. Please refer to After Visit Summary for other counseling recommendations.  Addditional time spent discussing her desire for a VBAC and whether she might be induced at 39 weeks. She is aware that Cytotec cannot be  used as she has Hx of CS. Reviewed the option of a foley ball to start and pitocin to follow.  Her cervix is not favorable now, the ultrasound growth is reassuring.  Return in about 1 week (around 07/11/2019) for return OB. Encouraged her to meet with one of the OB physicians to consider IOL vs repeat CS.  Imagene Riches, CNM  07/04/2019 2:27 PM

## 2019-07-04 NOTE — Patient Instructions (Signed)
Vaginal Birth After Cesarean Delivery  Vaginal birth after cesarean delivery (VBAC) is giving birth vaginally after previously delivering a baby through a cesarean section (C-section). A VBAC may be a safe option for you, depending on your health and other factors. It is important to discuss VBAC with your health care provider early in your pregnancy so you can understand the risks, benefits, and options. Having these discussions early will give you time to make your birth plan. Who are the best candidates for VBAC? The best candidates for VBAC are women who:  Have had one or two prior cesarean deliveries, and the incision made during the delivery was horizontal (low transverse).  Do not have a vertical (classical) scar on their uterus.  Have not had a tear in the wall of their uterus (uterine rupture).  Plan to have more pregnancies. A VBAC is also more likely to be successful:  In women who have previously given birth vaginally.  When labor starts by itself (spontaneously) before the due date. What are the benefits of VBAC? The benefits of delivering your baby vaginally instead of by a cesarean delivery include:  A shorter hospital stay.  A faster recovery time.  Less pain.  Avoiding risks associated with major surgery, such as infection and blood clots.  Less blood loss and less need for donated blood (transfusions). What are the risks of VBAC? The main risk of attempting a VBAC is that it may fail, forcing your health care provider to deliver your baby by a C-section. Other risks are rare and include:  Tearing (rupture) of the scar from a past cesarean delivery.  Other risks associated with vaginal deliveries. If a repeat cesarean delivery is needed, the risks include:  Blood loss.  Infection.  Blood clot.  Damage to surrounding organs.  Removal of the uterus (hysterectomy), if it is damaged.  Placenta problems in future pregnancies. What else should I know  about my options? Delivering a baby through a VBAC is similar to having a normal spontaneous vaginal delivery. Therefore, it is safe:  To try with twins.  For your health care provider to try to turn the baby from a breech position (external cephalic version) during labor.  With epidural analgesia for pain relief. Consider where you would like to deliver your baby. VBAC should be attempted in facilities where an emergency cesarean delivery can be performed. VBAC is not recommended for home births. Any changes in your health or your baby's health during your pregnancy may make it necessary to change your initial decision about VBAC. Your health care provider may recommend that you do not attempt a VBAC if:  Your baby's suspected weight is 8.8 lb (4 kg) or more.  You have preeclampsia. This is a condition that causes high blood pressure along with other symptoms, such as swelling and headaches.  You will have VBAC less than 19 months after your cesarean delivery.  You are past your due date.  You need to have labor started (induced) because your cervix is not ready for labor (unfavorable). Where to find more information  American Pregnancy Association: americanpregnancy.org  American Congress of Obstetricians and Gynecologists: acog.org Summary  Vaginal birth after cesarean delivery (VBAC) is giving birth vaginally after previously delivering a baby through a cesarean section (C-section). A VBAC may be a safe option for you, depending on your health and other factors.  Discuss VBAC with your health care provider early in your pregnancy so you can understand the risks, benefits, options, and   have plenty of time to make your birth plan.  The main risk of attempting a VBAC is that it may fail, forcing your health care provider to deliver your baby by a C-section. Other risks are rare. This information is not intended to replace advice given to you by your health care provider. Make sure  you discuss any questions you have with your health care provider. Document Revised: 05/09/2018 Document Reviewed: 04/20/2016 Elsevier Patient Education  2020 Elsevier Inc.  

## 2019-07-09 ENCOUNTER — Encounter: Payer: Self-pay | Admitting: Obstetrics and Gynecology

## 2019-07-09 ENCOUNTER — Ambulatory Visit (INDEPENDENT_AMBULATORY_CARE_PROVIDER_SITE_OTHER): Payer: 59 | Admitting: Obstetrics and Gynecology

## 2019-07-09 ENCOUNTER — Other Ambulatory Visit: Payer: Self-pay

## 2019-07-09 VITALS — BP 100/70 | Wt 202.0 lb

## 2019-07-09 DIAGNOSIS — O09899 Supervision of other high risk pregnancies, unspecified trimester: Secondary | ICD-10-CM

## 2019-07-09 DIAGNOSIS — O0993 Supervision of high risk pregnancy, unspecified, third trimester: Secondary | ICD-10-CM

## 2019-07-09 DIAGNOSIS — Z98891 History of uterine scar from previous surgery: Secondary | ICD-10-CM

## 2019-07-09 DIAGNOSIS — O09893 Supervision of other high risk pregnancies, third trimester: Secondary | ICD-10-CM

## 2019-07-09 DIAGNOSIS — Z3A38 38 weeks gestation of pregnancy: Secondary | ICD-10-CM

## 2019-07-09 DIAGNOSIS — O0990 Supervision of high risk pregnancy, unspecified, unspecified trimester: Secondary | ICD-10-CM

## 2019-07-09 LAB — POCT URINALYSIS DIPSTICK OB
Glucose, UA: NEGATIVE
POC,PROTEIN,UA: NEGATIVE

## 2019-07-09 NOTE — Progress Notes (Signed)
Routine Prenatal Care Visit  Subjective  Stacey Curtis is a 29 y.o. G2P1001 at [redacted]w[redacted]d being seen today for ongoing prenatal care.  She is currently monitored for the following issues for this high-risk pregnancy and has COMMON MIGRAINE; FIBROCYSTIC BREAST DISEASE, MINIMAL; Disturbed concentration; Anxiety and depression; Recurrent cystitis; Supervision of high risk pregnancy, antepartum; History of cesarean delivery; Short interval between pregnancies affecting pregnancy, antepartum; and Watery eyes on their problem list.  ----------------------------------------------------------------------------------- Patient reports no complaints.   Contractions: Irritability. Vag. Bleeding: None.  Movement: Present. Leaking Fluid denies.  ----------------------------------------------------------------------------------- The following portions of the patient's history were reviewed and updated as appropriate: allergies, current medications, past family history, past medical history, past social history, past surgical history and problem list. Problem list updated.  Objective  Blood pressure 100/70, weight 202 lb (91.6 kg), last menstrual period 10/15/2018, currently breastfeeding. Pregravid weight 185 lb (83.9 kg) Total Weight Gain 17 lb (7.711 kg) Urinalysis: Urine Protein Negative  Urine Glucose Negative  Fetal Status: Fetal Heart Rate (bpm): 145 Fundal Height: 38 cm Movement: Present     General:  Alert, oriented and cooperative. Patient is in no acute distress.  Skin: Skin is warm and dry. No rash noted.   Cardiovascular: Normal heart rate noted  Respiratory: Normal respiratory effort, no problems with respiration noted  Abdomen: Soft, gravid, appropriate for gestational age. Pain/Pressure: Present     Pelvic:  Cervical exam performed Dilation: 1.5 Effacement (%): 40 Station: -3  Extremities: Normal range of motion.  Edema: None  Mental Status: Normal mood and affect. Normal behavior.  Normal judgment and thought content.   Assessment   29 y.o. G2P1001 at [redacted]w[redacted]d by  07/22/2019, by Last Menstrual Period presenting for routine prenatal visit  Plan   pregnancy Problems (from 12/05/18 to present)    Problem Noted Resolved   Supervision of high risk pregnancy, antepartum 12/05/2018 by Rexene Agent, CNM No   Overview Addendum 07/04/2019  1:59 PM by Imagene Riches, [place] Prenatal Labs  Dating L=6 Blood type: --/--/A POS (01/27 0146)   Genetic Screen AFP:  neg   NIPS: diploid XY Antibody:NEG (01/27 0146)  Anatomic [place] Normal anatomy, posterior fundal placenta, female gender Rubella:   Immune Varicella:  Immune  GTT     Third trimester:  RPR: Non Reactive (01/27 0101)   Rhogam n/a HBsAg:   negative  TDaP vaccine           Flu Shot:declines HIV: Non Reactive (11/12 0919)   Baby Food         Breast                       [id number]  Contraception         Unsure Pap: 05/05/2017, NILM  CBB  No   CS/VBAC VBAC desired; CS for CPD   Support Person              Previous Version       Term labor symptoms and general obstetric precautions including but not limited to vaginal bleeding, contractions, leaking of fluid and fetal movement were reviewed in detail with the patient. Please refer to After Visit Summary for other counseling recommendations.   - Discussed TOLAC and IOL. She doesn't want to wait until 7/1 for IOL, if not in labor.  While not favorable at this time, she could have an induction attempted. We discussed strategic balancing of risk of induction with giving  her as much time as possible for spontaneous labor. She would like to be induced a little earlier than 7/1 and she is really trying to avoid a c-section, if possible.  IOL set for 6/27 at midnight.   Return in about 1 week (around 07/16/2019) for Routine Prenatal Appointment.  Thomasene Mohair, MD, [facility], [facility] 07/09/2019 9:02 AM

## 2019-07-13 ENCOUNTER — Encounter: Payer: 59 | Admitting: Obstetrics and Gynecology

## 2019-07-16 ENCOUNTER — Ambulatory Visit (INDEPENDENT_AMBULATORY_CARE_PROVIDER_SITE_OTHER): Payer: 59 | Admitting: Obstetrics

## 2019-07-16 ENCOUNTER — Other Ambulatory Visit: Payer: Self-pay

## 2019-07-16 ENCOUNTER — Inpatient Hospital Stay: Admission: RE | Admit: 2019-07-16 | Payer: 59 | Source: Ambulatory Visit

## 2019-07-16 VITALS — BP 100/70 | Wt 204.0 lb

## 2019-07-16 DIAGNOSIS — Z3A39 39 weeks gestation of pregnancy: Secondary | ICD-10-CM

## 2019-07-16 DIAGNOSIS — O0990 Supervision of high risk pregnancy, unspecified, unspecified trimester: Secondary | ICD-10-CM

## 2019-07-16 LAB — POCT URINALYSIS DIPSTICK OB
Glucose, UA: NEGATIVE
POC,PROTEIN,UA: NEGATIVE

## 2019-07-16 NOTE — Addendum Note (Signed)
Addended by: Cornelius Moras D on: 07/16/2019 09:34 AM   Modules accepted: Orders

## 2019-07-16 NOTE — Progress Notes (Signed)
IUP at 39 weeks , desires VBAc with TOL. Deneis vaginal bleeding, discharge or LOF. Baby moves well. Hx of previous Cs- she is set up for IOL at 40 weeks. Using Evening Primrose Oil, IC to provide cervical ripening. VE today: 1.5 cms/505 -3 station.  Gentle sweep performed. F/U I a few days with CNM for repeat VE.  Mirna Mires, CNM  07/16/2019 9:32 AM

## 2019-07-20 ENCOUNTER — Ambulatory Visit (INDEPENDENT_AMBULATORY_CARE_PROVIDER_SITE_OTHER): Payer: 59 | Admitting: Obstetrics

## 2019-07-20 ENCOUNTER — Other Ambulatory Visit: Payer: Self-pay

## 2019-07-20 VITALS — BP 120/80 | Wt 205.0 lb

## 2019-07-20 DIAGNOSIS — O0993 Supervision of high risk pregnancy, unspecified, third trimester: Secondary | ICD-10-CM

## 2019-07-20 DIAGNOSIS — Z3A39 39 weeks gestation of pregnancy: Secondary | ICD-10-CM

## 2019-07-20 DIAGNOSIS — O0990 Supervision of high risk pregnancy, unspecified, unspecified trimester: Secondary | ICD-10-CM

## 2019-07-20 LAB — POCT URINALYSIS DIPSTICK OB
Glucose, UA: NEGATIVE
POC,PROTEIN,UA: NEGATIVE

## 2019-07-20 NOTE — Addendum Note (Signed)
Addended by: Cornelius Moras D on: 07/20/2019 11:41 AM   Modules accepted: Orders

## 2019-07-20 NOTE — Progress Notes (Signed)
Routine Prenatal Care Visit  Subjective  Stacey Curtis is a 29 y.o. G2P1001 at [redacted]w[redacted]d being seen today for ongoing prenatal care.  She is currently monitored for the following issues for this high-risk pregnancy and has COMMON MIGRAINE; FIBROCYSTIC BREAST DISEASE, MINIMAL; Disturbed concentration; Anxiety and depression; Recurrent cystitis; Supervision of high risk pregnancy, antepartum; History of cesarean delivery; Short interval between pregnancies affecting pregnancy, antepartum; and Watery eyes on their problem list.  ----------------------------------------------------------------------------------- Patient reports no complaints.  She is set up for IOL intwo days. Plans foley ball and then pitocin. Committed to TOL. Denies any regular contractions, no LOF or vaginal bleeding. Contractions: Not present. Vag. Bleeding: None.  Movement: Present. Leaking Fluid denies.  ----------------------------------------------------------------------------------- The following portions of the patient's history were reviewed and updated as appropriate: allergies, current medications, past family history, past medical history, past social history, past surgical history and problem list. Problem list updated.  Objective  Blood pressure 120/80, weight 205 lb (93 kg), last menstrual period 10/15/2018, currently breastfeeding. Pregravid weight 185 lb (83.9 kg) Total Weight Gain 20 lb (9.072 kg) Urinalysis: Urine Protein    Urine Glucose    Fetal Status:     Movement: Present     General:  Alert, oriented and cooperative. Patient is in no acute distress.  Skin: Skin is warm and dry. No rash noted.   Cardiovascular: Normal heart rate noted  Respiratory: Normal respiratory effort, no problems with respiration noted  Abdomen: Soft, gravid, appropriate for gestational age. Pain/Pressure: Absent     Pelvic:  Cervical exam performed      1.5/505/-3  Vertex. Cervix posterior  Extremities: Normal range of  motion.     Mental Status: Normal mood and affect. Normal behavior. Normal judgment and thought content.   Assessment   29 y.o. G2P1001 at [redacted]w[redacted]d by  07/22/2019, by Last Menstrual Period presenting for routine prenatal visit  Plan   pregnancy Problems (from 12/05/18 to present)    Problem Noted Resolved   Supervision of high risk pregnancy, antepartum 12/05/2018 by Oswaldo Conroy, CNM No   Overview Addendum 07/04/2019  1:59 PM by Mirna Mires, CNM    Clinic [facility] Prenatal Labs  Dating L=6 Blood type: --/--/A POS (01/27 0146)   Genetic Screen AFP:  neg   NIPS: diploid XY Antibody:NEG (01/27 0146)  Anatomic [place] Normal anatomy, posterior fundal placenta, female gender Rubella:   Immune Varicella:  Immune  GTT     Third trimester:  RPR: Non Reactive (01/27 0101)   Rhogam n/a HBsAg:   negative  TDaP vaccine           Flu Shot:declines HIV: Non Reactive (11/12 0919)   Baby Food         Breast                       [id number]  Contraception         Unsure Pap: 05/05/2017, NILM  CBB  No   CS/VBAC VBAC desired; CS for CPD   Support Person              Previous Version       Term labor symptoms and general obstetric precautions including but not limited to vaginal bleeding, contractions, leaking of fluid and fetal movement were reviewed in detail with the patient. Please refer to After Visit Summary for other counseling recommendations.   Return if symptoms worsen or fail to improve.    Mirna Mires, CNM  07/20/2019 11:36 AM

## 2019-07-22 ENCOUNTER — Encounter: Payer: Self-pay | Admitting: Obstetrics and Gynecology

## 2019-07-22 ENCOUNTER — Inpatient Hospital Stay
Admission: AD | Admit: 2019-07-22 | Discharge: 2019-07-24 | DRG: 787 | Disposition: A | Discharge status: Home / Self Care | Payer: 59 | Attending: Obstetrics and Gynecology | Admitting: Obstetrics and Gynecology

## 2019-07-22 ENCOUNTER — Inpatient Hospital Stay: Payer: 59 | Admitting: Anesthesiology

## 2019-07-22 ENCOUNTER — Other Ambulatory Visit: Payer: Self-pay

## 2019-07-22 DIAGNOSIS — Z98891 History of uterine scar from previous surgery: Secondary | ICD-10-CM | POA: Diagnosis not present

## 2019-07-22 DIAGNOSIS — O0990 Supervision of high risk pregnancy, unspecified, unspecified trimester: Secondary | ICD-10-CM | POA: Diagnosis not present

## 2019-07-22 DIAGNOSIS — Z3A40 40 weeks gestation of pregnancy: Secondary | ICD-10-CM | POA: Diagnosis not present

## 2019-07-22 DIAGNOSIS — Z20822 Contact with and (suspected) exposure to covid-19: Secondary | ICD-10-CM | POA: Diagnosis present

## 2019-07-22 DIAGNOSIS — D62 Acute posthemorrhagic anemia: Secondary | ICD-10-CM | POA: Diagnosis not present

## 2019-07-22 DIAGNOSIS — O34211 Maternal care for low transverse scar from previous cesarean delivery: Principal | ICD-10-CM | POA: Diagnosis present

## 2019-07-22 DIAGNOSIS — O9081 Anemia of the puerperium: Secondary | ICD-10-CM | POA: Diagnosis not present

## 2019-07-22 DIAGNOSIS — Z3490 Encounter for supervision of normal pregnancy, unspecified, unspecified trimester: Secondary | ICD-10-CM | POA: Diagnosis present

## 2019-07-22 DIAGNOSIS — O09899 Supervision of other high risk pregnancies, unspecified trimester: Secondary | ICD-10-CM

## 2019-07-22 DIAGNOSIS — O26893 Other specified pregnancy related conditions, third trimester: Secondary | ICD-10-CM | POA: Diagnosis present

## 2019-07-22 LAB — SAMPLE TO BLOOD BANK

## 2019-07-22 LAB — SARS CORONAVIRUS 2 BY RT PCR (HOSPITAL ORDER, PERFORMED IN ~~LOC~~ HOSPITAL LAB): SARS Coronavirus 2: NEGATIVE

## 2019-07-22 LAB — TYPE AND SCREEN
ABO/RH(D): A POS
Antibody Screen: NEGATIVE

## 2019-07-22 LAB — RPR: RPR Ser Ql: NONREACTIVE

## 2019-07-22 LAB — CBC
HCT: 32.6 % — ABNORMAL LOW (ref 36.0–46.0)
Hemoglobin: 11.3 g/dL — ABNORMAL LOW (ref 12.0–15.0)
MCH: 29.3 pg (ref 26.0–34.0)
MCHC: 34.7 g/dL (ref 30.0–36.0)
MCV: 84.5 fL (ref 80.0–100.0)
Platelets: 227 10*3/uL (ref 150–400)
RBC: 3.86 MIL/uL — ABNORMAL LOW (ref 3.87–5.11)
RDW: 12.9 % (ref 11.5–15.5)
WBC: 8.1 10*3/uL (ref 4.0–10.5)
nRBC: 0 % (ref 0.0–0.2)

## 2019-07-22 MED ORDER — OXYTOCIN-SODIUM CHLORIDE 30-0.9 UT/500ML-% IV SOLN
1.0000 m[IU]/min | INTRAVENOUS | Status: DC
  Administered 2019-07-22: 1 m[IU]/min via INTRAVENOUS
  Filled 2019-07-22: qty 1000

## 2019-07-22 MED ORDER — CALCIUM CARBONATE ANTACID 500 MG PO CHEW
2.0000 | CHEWABLE_TABLET | ORAL | Status: AC
  Administered 2019-07-22: 400 mg via ORAL
  Filled 2019-07-22: qty 2

## 2019-07-22 MED ORDER — FENTANYL 2.5 MCG/ML W/ROPIVACAINE 0.15% IN NS 100 ML EPIDURAL (ARMC)
12.0000 mL/h | EPIDURAL | Status: DC
  Administered 2019-07-22 – 2019-07-23 (×3): 12 mL/h via EPIDURAL
  Filled 2019-07-22 (×2): qty 100

## 2019-07-22 MED ORDER — LIDOCAINE HCL (PF) 1 % IJ SOLN: 30.0000 mL | INTRAMUSCULAR | Status: DC | PRN

## 2019-07-22 MED ORDER — ONDANSETRON HCL 4 MG/2ML IJ SOLN
4.0000 mg | Freq: Four times a day (QID) | INTRAMUSCULAR | Status: DC | PRN
  Administered 2019-07-22 – 2019-07-23 (×4): 4 mg via INTRAVENOUS
  Filled 2019-07-22 (×4): qty 2

## 2019-07-22 MED ORDER — MISOPROSTOL 200 MCG PO TABS
ORAL_TABLET | ORAL | Status: AC
  Filled 2019-07-22: qty 4

## 2019-07-22 MED ORDER — PHENYLEPHRINE 40 MCG/ML (10ML) SYRINGE FOR IV PUSH (FOR BLOOD PRESSURE SUPPORT): 80.0000 ug | PREFILLED_SYRINGE | INTRAVENOUS | Status: DC | PRN

## 2019-07-22 MED ORDER — LIDOCAINE HCL (PF) 1 % IJ SOLN
INTRAMUSCULAR | Status: AC
  Filled 2019-07-22: qty 30

## 2019-07-22 MED ORDER — LACTATED RINGERS IV SOLN: 500.0000 mL | INTRAVENOUS | Status: DC | PRN

## 2019-07-22 MED ORDER — LIDOCAINE-EPINEPHRINE (PF) 1.5 %-1:200000 IJ SOLN
INTRAMUSCULAR | Status: DC | PRN
  Administered 2019-07-22: 3 mL via EPIDURAL

## 2019-07-22 MED ORDER — LIDOCAINE HCL (PF) 1 % IJ SOLN
INTRAMUSCULAR | Status: DC | PRN
  Administered 2019-07-22: 1 mL via INTRADERMAL

## 2019-07-22 MED ORDER — SOD CITRATE-CITRIC ACID 500-334 MG/5ML PO SOLN
30.0000 mL | ORAL | Status: DC | PRN
  Filled 2019-07-22: qty 30

## 2019-07-22 MED ORDER — OXYTOCIN 10 UNIT/ML IJ SOLN: 10.0000 [IU] | Freq: Once | INTRAMUSCULAR | Status: DC

## 2019-07-22 MED ORDER — EPHEDRINE 5 MG/ML INJ: 10.0000 mg | INTRAVENOUS | Status: DC | PRN

## 2019-07-22 MED ORDER — DIPHENHYDRAMINE HCL 50 MG/ML IJ SOLN
50.0000 mg | Freq: Once | INTRAMUSCULAR | Status: AC
  Administered 2019-07-22: 50 mg via INTRAVENOUS
  Filled 2019-07-22: qty 1

## 2019-07-22 MED ORDER — LACTATED RINGERS IV SOLN
500.0000 mL | Freq: Once | INTRAVENOUS | Status: AC
  Administered 2019-07-22: 500 mL via INTRAVENOUS

## 2019-07-22 MED ORDER — AMMONIA AROMATIC IN INHA
RESPIRATORY_TRACT | Status: AC
  Filled 2019-07-22: qty 10

## 2019-07-22 MED ORDER — SODIUM CHLORIDE 0.9 % IV SOLN
INTRAVENOUS | Status: DC | PRN
  Administered 2019-07-22 (×2): 5 mL via EPIDURAL

## 2019-07-22 MED ORDER — OXYTOCIN 10 UNIT/ML IJ SOLN
INTRAMUSCULAR | Status: AC
  Filled 2019-07-22: qty 2

## 2019-07-22 MED ORDER — FAMOTIDINE 20 MG PO TABS
20.0000 mg | ORAL_TABLET | Freq: Two times a day (BID) | ORAL | Status: DC
  Administered 2019-07-22: 20 mg via ORAL
  Filled 2019-07-22: qty 1

## 2019-07-22 MED ORDER — LACTATED RINGERS IV SOLN: INTRAVENOUS | Status: DC

## 2019-07-22 MED ORDER — OXYTOCIN BOLUS FROM INFUSION: 333.0000 mL | Freq: Once | INTRAVENOUS | Status: DC

## 2019-07-22 MED ORDER — DIPHENHYDRAMINE HCL 50 MG/ML IJ SOLN: 12.5000 mg | INTRAMUSCULAR | Status: DC | PRN

## 2019-07-22 MED ORDER — FENTANYL 2.5 MCG/ML W/ROPIVACAINE 0.15% IN NS 100 ML EPIDURAL (ARMC)
EPIDURAL | Status: AC
  Filled 2019-07-22: qty 100

## 2019-07-22 MED ORDER — TERBUTALINE SULFATE 1 MG/ML IJ SOLN: 0.2500 mg | Freq: Once | INTRAMUSCULAR | Status: DC | PRN

## 2019-07-22 MED ORDER — OXYTOCIN-SODIUM CHLORIDE 30-0.9 UT/500ML-% IV SOLN
2.5000 [IU]/h | INTRAVENOUS | Status: DC
  Administered 2019-07-23: 30 [IU]/h via INTRAVENOUS

## 2019-07-22 NOTE — Anesthesia Procedure Notes (Signed)
Epidural Patient location during procedure: OB Start time: 07/22/2019 12:17 PM End time: 07/22/2019 12:20 PM  Staffing Anesthesiologist: Sai Zinn, Cleda Mccreedy, MD Performed: anesthesiologist   Preanesthetic Checklist Completed: patient identified, IV checked, site marked, risks and benefits discussed, surgical consent, monitors and equipment checked, pre-op evaluation and timeout performed  Epidural Patient position: sitting Prep: ChloraPrep Patient monitoring: heart rate, continuous pulse ox and blood pressure Approach: midline Location: L3-L4 Injection technique: LOR saline  Needle:  Needle type: Tuohy  Needle gauge: 17 G Needle length: 9 cm and 9 Needle insertion depth: 5 cm Catheter type: closed end flexible Catheter size: 19 Gauge Catheter at skin depth: 10 cm Test dose: negative and 1.5% lidocaine with Epi 1:200 K  Assessment Sensory level: T10 Events: blood not aspirated, injection not painful, no injection resistance, no paresthesia and negative IV test  Additional Notes 1 attempt Pt. Evaluated and documentation done after procedure finished. Patient identified. Risks/Benefits/Options discussed with patient including but not limited to bleeding, infection, nerve damage, paralysis, failed block, incomplete pain control, headache, blood pressure changes, nausea, vomiting, reactions to medication both or allergic, itching and postpartum back pain. Confirmed with bedside nurse the patient's most recent platelet count. Confirmed with patient that they are not currently taking any anticoagulation, have any bleeding history or any family history of bleeding disorders. Patient expressed understanding and wished to proceed. All questions were answered. Sterile technique was used throughout the entire procedure. Please see nursing notes for vital signs. Test dose was given through epidural catheter and negative prior to continuing to dose epidural or start infusion. Warning signs of  high block given to the patient including shortness of breath, tingling/numbness in hands, complete motor block, or any concerning symptoms with instructions to call for help. Patient was given instructions on fall risk and not to get out of bed. All questions and concerns addressed with instructions to call with any issues or inadequate analgesia.   Patient tolerated the insertion well without immediate complications.Reason for block:procedure for pain

## 2019-07-22 NOTE — H&P (Signed)
OB History & Physical   History of Present Illness:  Chief Complaint: here for induction of labor  HPI:  Stacey Curtis is a 29 y.o. G2P1001 female at [redacted]w[redacted]d dated by LMP consistent with 6 week ultrasound.  Her pregnancy has been complicated by history of cesarean delivery due to active phase arrest.    She denies contractions.   She denies leakage of fluid.   She denies vaginal bleeding.   She reports fetal movement.    Total weight gain for pregnancy: 8.618 kg   Obstetrical Problem List: pregnancy Problems (from 12/05/18 to present)    Problem Noted Resolved   Supervision of high risk pregnancy, antepartum 12/05/2018 by Oswaldo Conroy, CNM No   Overview Addendum 07/04/2019  1:59 PM by Mirna Mires, CNM    Clinic [facility] Prenatal Labs  Dating L=6 Blood type: --/--/A POS (01/27 0146)   Genetic Screen AFP:  neg   NIPS: diploid XY Antibody:NEG (01/27 0146)  Anatomic [place] Normal anatomy, posterior fundal placenta, female gender Rubella:   Immune Varicella:  Immune  GTT     Third trimester:  RPR: Non Reactive (01/27 0101)   Rhogam n/a HBsAg:   negative  TDaP vaccine           Flu Shot:declines HIV: Non Reactive (11/12 0919)   Baby Food         Breast                       [id number]  Contraception         Unsure Pap: 05/05/2017, NILM  CBB  No   CS/VBAC VBAC desired; CS for CPD   Support Person              Previous Version       Maternal Medical History:   Past Medical History:  Diagnosis Date  . Anxiety and depression   . Common migraine   . Nephrolithiasis   . 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 Recurrent cystitis     Past Surgical History:  Procedure Laterality Date  . CESAREAN SECTION N/A 02/22/2018   Procedure: CESAREAN SECTION;  Surgeon: [name], MD;  Location: [facility] ORS;  Service: Obstetrics;  Laterality: N/A;  . TONSILLECTOMY  2011    Allergies  Allergen Reactions  . Cephalexin  Anaphylaxis  . Sulfa Antibiotics Anaphylaxis    Prior to Admission medications   Medication Sig Start Date End Date Taking? Authorizing Provider  doxylamine, Sleep, (UNISOM) 25 MG tablet Take 25 mg by mouth at bedtime as needed for sleep.   Yes [provider]  Prenatal Vit-Fe Fumarate-FA (PRENATAL VITAMIN PO) Take 2 tablets by mouth at bedtime.    Yes [provider]    OB History  Gravida Para Term Preterm AB Living  2 1 1  0 0 1  SAB TAB Ectopic Multiple Live Births  0 0 0 0 1    # Outcome Date GA Lbr Len/2nd Weight Sex Delivery Anes PTL Lv  2 Current           1 Term 02/22/18 [redacted]w[redacted]d  3640 g M CS-LTranv Gen, EPI  LIV     Complications: Failure to Progress in First Stage    Prenatal care site: [facility]  Social History: She  reports that she has never smoked. She has never used smokeless tobacco. She reports current alcohol use. She reports that she  does not use drugs.  Family History: family history includes Cardiomyopathy in her father and paternal grandfather; Cerebral palsy in her cousin; Melanoma in her paternal grandmother.   Review of Systems:  Review of Systems  Constitutional: Negative.   HENT: Negative.   Eyes: Negative.   Respiratory: Negative.   Cardiovascular: Negative.   Gastrointestinal: Negative.   Genitourinary: Negative.   Musculoskeletal: Negative.   Skin: Negative.   Neurological: Negative.   Psychiatric/Behavioral: Negative.      Physical Exam:  BP 97/70 (BP Location: Left Arm)   Pulse 88   Temp 98.1 F (36.7 C) (Oral)   Resp 18   Ht 5\' 5"  (1.651 m)   Wt 92.5 kg   LMP 10/15/2018 (Exact Date)   BMI 33.95 kg/m   Physical Exam Constitutional:      General: She is not in acute distress.    Appearance: Normal appearance. She is well-developed.  Genitourinary:     Genitourinary Comments: cvx 1.5 cm/50/-3 Foley placed under direct visualization, instilled with 40 mL sterile saline  HENT:     Head: Normocephalic and  atraumatic.  Eyes:     General: No scleral icterus.    Conjunctiva/sclera: Conjunctivae normal.  Cardiovascular:     Rate and Rhythm: Normal rate and regular rhythm.     Heart sounds: No murmur heard.  No friction rub. No gallop.   Pulmonary:     Effort: Pulmonary effort is normal. No respiratory distress.     Breath sounds: Normal breath sounds. No wheezing or rales.  Abdominal:     General: Bowel sounds are normal. There is no distension.     Palpations: Abdomen is soft. There is no mass.     Tenderness: There is no abdominal tenderness. There is no guarding or rebound.  Musculoskeletal:        General: Normal range of motion.     Cervical back: Normal range of motion and neck supple.  Neurological:     General: No focal deficit present.     Mental Status: She is alert and oriented to person, place, and time.     Cranial Nerves: No cranial nerve deficit.  Skin:    General: Skin is warm and dry.     Findings: No erythema.  Psychiatric:        Mood and Affect: Mood normal.        Behavior: Behavior normal.        Judgment: Judgment normal.   Female chaperone present for pelvic exam:      Baseline FHR: 130 beats/min   Variability: moderate   Accelerations: present   Decelerations: absent Contractions: present frequency: irregular, infrequent Overall assessment: cat 1  Lab Results  Component Value Date   SARSCOV2NAA NEGATIVE 07/22/2019    Assessment:  Stacey Curtis is a 29 y.o. G86P1001 female at [redacted]w[redacted]d with induction of labor, desires trial of labor after cesarean.   Plan:  1. Admit to Labor & Delivery  2. CBC, T&S, Clrs, IVF 3. GBS negative 6/5.   4. Fetwal well-being: reassuring 5. Patient extensively counseled regarding risks of TOLAC vs RCD.  She voices understanding and elects to proceed.    Prentice Docker, MD 07/22/2019 2:46 AM

## 2019-07-22 NOTE — Progress Notes (Signed)
Labor Check  Subj:  Complaints: comfortable with epidural   Obj:  BP 115/78   Pulse 81   Temp 97.8 F (36.6 C) (Oral)   Resp 18   Ht 5\' 5"  (1.651 m)   Wt 92.5 kg   LMP 10/15/2018 (Exact Date)   SpO2 100%   BMI 33.95 kg/m  Dose (milli-units/min) Oxytocin: 6 milli-units/min  Cervix: Dilation: 5 / Effacement (%): 70 / Station: -2   AROM: clear fluid Baseline FHR: 130 beats/min   Variability: moderate   Accelerations: present   Decelerations: absent Contractions: present frequency: 3-4 q 10 min Overall assessment: cat 1  A/P: 29 y.o. G2P1001 female at [redacted]w[redacted]d with IOL, TOLAC.  1.  Labor: AROM -clear fluid, reduce pitocin dose from 12 to 6.    2.  FWB: reassuring, Overall assessment: category 1  3.  GBS negative 4.  Pain: epidural 5.  Recheck: prn   [redacted]w[redacted]d, MD, Thomasene Mohair OB/GYN, [facility] 07/22/2019 1:20 PM

## 2019-07-22 NOTE — Progress Notes (Signed)
Labor Check  Subj:  Complaints: comfortable with epidural    Obj:  BP 115/78   Pulse 81   Temp (!) 97.5 F (36.4 C) (Oral)   Resp 18   Ht 5\' 5"  (1.651 m)   Wt 92.5 kg   LMP 10/15/2018 (Exact Date)   SpO2 100%   BMI 33.95 kg/m  Dose (milli-units/min) Oxytocin: 12 milli-units/min  Cervix: Dilation: 5 / Effacement (%): 70 / Station: -2   IUPC placed without difficulty Baseline FHR: 120 beats/min   Variability: moderate   Accelerations: present   Decelerations: absent Contractions: present frequency: not tracing well Overall assessment: cat 1  Female chaperone present for pelvic exam:   A/P: 29 y.o. G2P1001 female at [redacted]w[redacted]d with IOL, TOLAC.  1.  Labor: IUPC placed without difficulty.  Titrate pitocin to adequacy.    2.  FWB: reassuring , Overall assessment: category 1  3.  GBS negative  4.  Pain: epidural 5.  Recheck: prn.   [redacted]w[redacted]d, MD, Thomasene Mohair OB/GYN, [facility] 07/22/2019 4:45 PM

## 2019-07-22 NOTE — Progress Notes (Signed)
Labor Check  Subj:  Complaints: comfortable with epidural   Obj:  BP 121/69   Pulse 84   Temp 98.6 F (37 C) (Oral)   Resp 17   Ht 5\' 5"  (1.651 m)   Wt 92.5 kg   LMP 10/15/2018 (Exact Date)   SpO2 100%   BMI 33.95 kg/m  Dose (milli-units/min) Oxytocin: 15 milli-units/min  Cervix: Dilation: 7 / Effacement (%): 90 / Station: 0  Baseline FHR: 135 beats/min   Variability: moderate   Accelerations: present   Decelerations: present (occasional variable deceleration with moderate variability and quick return to baseline) Contractions: present frequency: 3-4 q 10 min Overall assessment: cat 2   Female chaperone present for pelvic exam:   A/P: 29 y.o. G2P1001 female at [redacted]w[redacted]d with IOL, TOLAC.  1.  Labor: continue pitocin, most intervals with adequate mVUs  2.  FWB: reassuring, Overall assessment: category 2.   3.  GBS negative  4.  Pain: epidural 5.  Recheck: prn   [redacted]w[redacted]d, MD, Thomasene Mohair OB/GYN, [facility] 07/22/2019 11:17 PM

## 2019-07-22 NOTE — Anesthesia Preprocedure Evaluation (Addendum)
Anesthesia Evaluation  Patient identified by MRN, date of birth, ID band Patient awake    Reviewed: Allergy & Precautions, H&P , NPO status , Patient's Chart, lab work & pertinent test results  History of Anesthesia Complications (+) history of anesthetic complications (Failed Epidural)  Airway Mallampati: III  TM Distance: >3 FB Neck ROM: full    Dental  (+) Chipped   Pulmonary neg pulmonary ROS,    Pulmonary exam normal        Cardiovascular Exercise Tolerance: Good (-) hypertensionnegative cardio ROS Normal cardiovascular exam     Neuro/Psych  Headaches, PSYCHIATRIC DISORDERS    GI/Hepatic negative GI ROS,   Endo/Other    Renal/GU Renal disease  negative genitourinary   Musculoskeletal   Abdominal   Peds  Hematology negative hematology ROS (+)   Anesthesia Other Findings TOLAC  Past Medical History: No date: Anxiety and depression No date: Common migraine No date: Nephrolithiasis 08/16/2008: PATELLO-FEMORAL SYNDROME     Comment:  Qualifier: Diagnosis of  By: Copland MD, Karleen Hampshire   No date: Recurrent cystitis  Past Surgical History: 02/22/2018: CESAREAN SECTION; N/A     Comment:  Procedure: CESAREAN SECTION;  Surgeon: [name], MD;  Location: [facility] ORS;  Service: Obstetrics;                Laterality: N/A; 2011: TONSILLECTOMY  BMI    Body Mass Index: 33.95 kg/m      Reproductive/Obstetrics (+) Pregnancy                            Anesthesia Physical Anesthesia Plan  ASA: III  Anesthesia Plan: Spinal   Post-op Pain Management:    Induction:   PONV Risk Score and Plan:   Airway Management Planned: Natural Airway  Additional Equipment:   Intra-op Plan:   Post-operative Plan:   Informed Consent: I have reviewed the patients History and Physical, chart, labs and discussed the procedure including the risks, benefits and alternatives for the  proposed anesthesia with the patient or authorized representative who has indicated his/her understanding and acceptance.     Dental Advisory Given  Plan Discussed with: Anesthesiologist  Anesthesia Plan Comments: (Patient is uncomfortable with epidural at this time. No level to ice and strong motor in lower extremities.  Patient required GA because of failed epidural for 1st c section.  Plan to removal epidural and place spinal.  Patient reports no bleeding problems and no anticoagulant use.  Plan for spinal with backup GA  Patient consented for risks of anesthesia including but not limited to:  - adverse reactions to medications - damage to eyes, teeth, lips or other oral mucosa - nerve damage due to positioning  - risk of bleeding, infection and or nerve damage from spinal that could lead to paralysis - risk of headache or failed spinal - damage to teeth, lips or other oral mucosa - sore throat or hoarseness - damage to heart, brain, nerves, lungs, other parts of body or loss of life  Patient voiced understanding.)      Anesthesia Quick Evaluation

## 2019-07-23 ENCOUNTER — Encounter
Admission: AD | Disposition: A | Discharge status: Home / Self Care | Payer: Self-pay | Source: Home / Self Care | Attending: Obstetrics and Gynecology

## 2019-07-23 ENCOUNTER — Encounter: Payer: Self-pay | Admitting: Obstetrics and Gynecology

## 2019-07-23 DIAGNOSIS — Z3490 Encounter for supervision of normal pregnancy, unspecified, unspecified trimester: Secondary | ICD-10-CM

## 2019-07-23 DIAGNOSIS — Z98891 History of uterine scar from previous surgery: Secondary | ICD-10-CM

## 2019-07-23 DIAGNOSIS — O0990 Supervision of high risk pregnancy, unspecified, unspecified trimester: Secondary | ICD-10-CM

## 2019-07-23 SURGERY — Surgical Case
Anesthesia: Epidural

## 2019-07-23 MED ORDER — FERROUS SULFATE 325 (65 FE) MG PO TABS
325.0000 mg | ORAL_TABLET | Freq: Two times a day (BID) | ORAL | Status: DC
  Administered 2019-07-23 – 2019-07-24 (×2): 325 mg via ORAL
  Filled 2019-07-23 (×3): qty 1

## 2019-07-23 MED ORDER — FENTANYL CITRATE (PF) 100 MCG/2ML IJ SOLN
INTRAMUSCULAR | Status: DC | PRN
  Administered 2019-07-23 (×2): 25 ug via INTRAVENOUS
  Administered 2019-07-23: 35 ug via INTRAVENOUS

## 2019-07-23 MED ORDER — NALOXONE HCL 4 MG/10ML IJ SOLN
1.0000 ug/kg/h | INTRAVENOUS | Status: DC | PRN
  Filled 2019-07-23: qty 5

## 2019-07-23 MED ORDER — SOD CITRATE-CITRIC ACID 500-334 MG/5ML PO SOLN
30.0000 mL | ORAL | Status: AC
  Administered 2019-07-23: 30 mL via ORAL

## 2019-07-23 MED ORDER — COCONUT OIL OIL
1.0000 "application " | TOPICAL_OIL | Status: DC | PRN
  Administered 2019-07-23: 1 via TOPICAL
  Filled 2019-07-23: qty 120

## 2019-07-23 MED ORDER — WITCH HAZEL-GLYCERIN EX PADS: 1.0000 "application " | MEDICATED_PAD | CUTANEOUS | Status: DC | PRN

## 2019-07-23 MED ORDER — CARBOPROST TROMETHAMINE 250 MCG/ML IM SOLN
INTRAMUSCULAR | Status: AC
  Filled 2019-07-23: qty 1

## 2019-07-23 MED ORDER — DIPHENHYDRAMINE HCL 50 MG/ML IJ SOLN: 12.5000 mg | INTRAMUSCULAR | Status: DC | PRN

## 2019-07-23 MED ORDER — OXYCODONE HCL 5 MG/5ML PO SOLN: 5.0000 mg | Freq: Once | ORAL | Status: DC | PRN

## 2019-07-23 MED ORDER — BUPIVACAINE 0.25 % ON-Q PUMP DUAL CATH 400 ML
400.0000 mL | INJECTION | Status: DC
  Filled 2019-07-23: qty 400

## 2019-07-23 MED ORDER — METHYLERGONOVINE MALEATE 0.2 MG/ML IJ SOLN
INTRAMUSCULAR | Status: AC
  Filled 2019-07-23: qty 1

## 2019-07-23 MED ORDER — FENTANYL CITRATE (PF) 100 MCG/2ML IJ SOLN: 25.0000 ug | INTRAMUSCULAR | Status: DC | PRN

## 2019-07-23 MED ORDER — SIMETHICONE 80 MG PO CHEW
80.0000 mg | CHEWABLE_TABLET | Freq: Three times a day (TID) | ORAL | Status: DC
  Administered 2019-07-23 – 2019-07-24 (×4): 80 mg via ORAL
  Filled 2019-07-23 (×4): qty 1

## 2019-07-23 MED ORDER — CLINDAMYCIN PHOSPHATE 900 MG/50ML IV SOLN
900.0000 mg | INTRAVENOUS | Status: AC
  Administered 2019-07-23: 900 mg via INTRAVENOUS

## 2019-07-23 MED ORDER — OXYCODONE HCL 5 MG PO TABS: 5.0000 mg | ORAL_TABLET | Freq: Four times a day (QID) | ORAL | Status: AC | PRN

## 2019-07-23 MED ORDER — KETOROLAC TROMETHAMINE 30 MG/ML IJ SOLN: 30.0000 mg | Freq: Four times a day (QID) | INTRAMUSCULAR | Status: AC

## 2019-07-23 MED ORDER — FENTANYL CITRATE (PF) 100 MCG/2ML IJ SOLN
INTRAMUSCULAR | Status: DC | PRN
  Administered 2019-07-23: 15 ug via INTRATHECAL

## 2019-07-23 MED ORDER — OXYCODONE-ACETAMINOPHEN 5-325 MG PO TABS: 2.0000 | ORAL_TABLET | ORAL | Status: DC | PRN

## 2019-07-23 MED ORDER — LACTATED RINGERS IV SOLN: INTRAVENOUS | Status: DC

## 2019-07-23 MED ORDER — MEPERIDINE HCL 25 MG/ML IJ SOLN: 6.2500 mg | INTRAMUSCULAR | Status: DC | PRN

## 2019-07-23 MED ORDER — ONDANSETRON HCL 4 MG/2ML IJ SOLN
INTRAMUSCULAR | Status: DC | PRN
  Administered 2019-07-23: 4 mg via INTRAVENOUS

## 2019-07-23 MED ORDER — OXYCODONE-ACETAMINOPHEN 5-325 MG PO TABS
1.0000 | ORAL_TABLET | ORAL | Status: DC | PRN
  Administered 2019-07-24 (×2): 1 via ORAL
  Filled 2019-07-23 (×2): qty 1

## 2019-07-23 MED ORDER — MORPHINE SULFATE (PF) 0.5 MG/ML IJ SOLN
INTRAMUSCULAR | Status: AC
  Filled 2019-07-23: qty 10

## 2019-07-23 MED ORDER — ONDANSETRON HCL 4 MG/2ML IJ SOLN
INTRAMUSCULAR | Status: AC
  Filled 2019-07-23: qty 2

## 2019-07-23 MED ORDER — MORPHINE SULFATE (PF) 0.5 MG/ML IJ SOLN: INTRAMUSCULAR | Status: DC | PRN

## 2019-07-23 MED ORDER — SODIUM CHLORIDE 0.9 % IV SOLN
500.0000 mg | Freq: Once | INTRAVENOUS | Status: AC
  Administered 2019-07-23: 500 mg via INTRAVENOUS
  Filled 2019-07-23: qty 500

## 2019-07-23 MED ORDER — MENTHOL 3 MG MT LOZG: 1.0000 | LOZENGE | OROMUCOSAL | Status: DC | PRN

## 2019-07-23 MED ORDER — BUPIVACAINE HCL (PF) 0.5 % IJ SOLN
INTRAMUSCULAR | Status: DC | PRN
  Administered 2019-07-23: 10 mL

## 2019-07-23 MED ORDER — BUPIVACAINE HCL (PF) 0.5 % IJ SOLN: 5.0000 mL | Freq: Once | INTRAMUSCULAR | Status: DC

## 2019-07-23 MED ORDER — OXYCODONE HCL 5 MG PO TABS: 5.0000 mg | ORAL_TABLET | Freq: Once | ORAL | Status: DC | PRN

## 2019-07-23 MED ORDER — ONDANSETRON HCL 4 MG/2ML IJ SOLN: 4.0000 mg | Freq: Three times a day (TID) | INTRAMUSCULAR | Status: DC | PRN

## 2019-07-23 MED ORDER — GENTAMICIN SULFATE 40 MG/ML IJ SOLN
5.0000 mg/kg | INTRAVENOUS | Status: AC
  Administered 2019-07-23: 462.4 mg via INTRAVENOUS
  Filled 2019-07-23: qty 11.5

## 2019-07-23 MED ORDER — DIBUCAINE (PERIANAL) 1 % EX OINT: 1.0000 "application " | TOPICAL_OINTMENT | CUTANEOUS | Status: DC | PRN

## 2019-07-23 MED ORDER — SODIUM CHLORIDE 0.9 % IV SOLN
INTRAVENOUS | Status: DC | PRN
  Administered 2019-07-23: 50 ug/min via INTRAVENOUS

## 2019-07-23 MED ORDER — ZOLPIDEM TARTRATE 5 MG PO TABS: 5.0000 mg | ORAL_TABLET | Freq: Every evening | ORAL | Status: DC | PRN

## 2019-07-23 MED ORDER — NALBUPHINE HCL 10 MG/ML IJ SOLN
5.0000 mg | Freq: Once | INTRAMUSCULAR | Status: DC | PRN
  Filled 2019-07-23: qty 1

## 2019-07-23 MED ORDER — NALBUPHINE HCL 10 MG/ML IJ SOLN: 5.0000 mg | INTRAMUSCULAR | Status: DC | PRN

## 2019-07-23 MED ORDER — KETOROLAC TROMETHAMINE 30 MG/ML IJ SOLN
30.0000 mg | Freq: Four times a day (QID) | INTRAMUSCULAR | Status: AC
  Administered 2019-07-23 – 2019-07-24 (×4): 30 mg via INTRAVENOUS
  Filled 2019-07-23 (×4): qty 1

## 2019-07-23 MED ORDER — FENTANYL CITRATE (PF) 100 MCG/2ML IJ SOLN
INTRAMUSCULAR | Status: AC
  Filled 2019-07-23: qty 2

## 2019-07-23 MED ORDER — ACETAMINOPHEN 325 MG PO TABS
650.0000 mg | ORAL_TABLET | Freq: Four times a day (QID) | ORAL | Status: AC
  Administered 2019-07-23 – 2019-07-24 (×4): 650 mg via ORAL
  Filled 2019-07-23 (×4): qty 2

## 2019-07-23 MED ORDER — NALBUPHINE HCL 10 MG/ML IJ SOLN: 5.0000 mg | Freq: Once | INTRAMUSCULAR | Status: DC | PRN

## 2019-07-23 MED ORDER — MORPHINE SULFATE (PF) 0.5 MG/ML IJ SOLN
INTRAMUSCULAR | Status: DC | PRN
  Administered 2019-07-23: 100 ug via INTRATHECAL

## 2019-07-23 MED ORDER — PRENATAL MULTIVITAMIN CH
1.0000 | ORAL_TABLET | Freq: Every day | ORAL | Status: DC
  Administered 2019-07-23 – 2019-07-24 (×2): 1 via ORAL
  Filled 2019-07-23 (×2): qty 1

## 2019-07-23 MED ORDER — IBUPROFEN 600 MG PO TABS: 600.0000 mg | ORAL_TABLET | Freq: Four times a day (QID) | ORAL | Status: DC

## 2019-07-23 MED ORDER — PROPOFOL 10 MG/ML IV BOLUS
INTRAVENOUS | Status: AC
  Filled 2019-07-23: qty 20

## 2019-07-23 MED ORDER — SENNOSIDES-DOCUSATE SODIUM 8.6-50 MG PO TABS
2.0000 | ORAL_TABLET | ORAL | Status: DC
  Administered 2019-07-23: 2 via ORAL
  Filled 2019-07-23: qty 2

## 2019-07-23 MED ORDER — OXYTOCIN-SODIUM CHLORIDE 30-0.9 UT/500ML-% IV SOLN
2.5000 [IU]/h | INTRAVENOUS | Status: AC
  Administered 2019-07-23: 2.5 [IU]/h via INTRAVENOUS

## 2019-07-23 MED ORDER — SODIUM CHLORIDE 0.9% FLUSH: 3.0000 mL | INTRAVENOUS | Status: DC | PRN

## 2019-07-23 MED ORDER — NALOXONE HCL 0.4 MG/ML IJ SOLN: 0.4000 mg | INTRAMUSCULAR | Status: DC | PRN

## 2019-07-23 MED ORDER — DIPHENHYDRAMINE HCL 25 MG PO CAPS
25.0000 mg | ORAL_CAPSULE | ORAL | Status: DC | PRN
  Administered 2019-07-23: 25 mg via ORAL
  Filled 2019-07-23: qty 1

## 2019-07-23 MED ORDER — NALBUPHINE HCL 10 MG/ML IJ SOLN
5.0000 mg | INTRAMUSCULAR | Status: DC | PRN
  Administered 2019-07-23: 5 mg via INTRAVENOUS

## 2019-07-23 MED ORDER — DIPHENHYDRAMINE HCL 25 MG PO CAPS: 25.0000 mg | ORAL_CAPSULE | Freq: Four times a day (QID) | ORAL | Status: DC | PRN

## 2019-07-23 MED ORDER — BUPIVACAINE IN DEXTROSE 0.75-8.25 % IT SOLN
INTRATHECAL | Status: DC | PRN
  Administered 2019-07-23: 1.6 mL via INTRATHECAL

## 2019-07-23 MED ORDER — MISOPROSTOL 200 MCG PO TABS
ORAL_TABLET | ORAL | Status: AC
  Filled 2019-07-23: qty 5

## 2019-07-23 SURGICAL SUPPLY — 25 items
ADH SKN CLS APL DERMABOND .7 (GAUZE/BANDAGES/DRESSINGS) ×1
CANISTER SUCT 3000ML PPV (MISCELLANEOUS) ×1 IMPLANT
CATH KIT ON-Q SILVERSOAK 5 (CATHETERS) IMPLANT
CATH KIT ON-Q SILVERSOAK 5IN (CATHETERS) ×4 IMPLANT
DERMABOND ADVANCED (GAUZE/BANDAGES/DRESSINGS) ×1
DERMABOND ADVANCED .7 DNX12 (GAUZE/BANDAGES/DRESSINGS) IMPLANT
DRSG OPSITE POSTOP 4X10 (GAUZE/BANDAGES/DRESSINGS) ×1 IMPLANT
ELECT CAUTERY BLADE 6.4 (BLADE) ×1 IMPLANT
ELECT REM PT RETURN 9FT ADLT (ELECTROSURGICAL) ×2
ELECTRODE REM PT RTRN 9FT ADLT (ELECTROSURGICAL) IMPLANT
GLOVE BIO SURGEON STRL SZ7 (GLOVE) ×1 IMPLANT
GLOVE INDICATOR 7.5 STRL GRN (GLOVE) ×1 IMPLANT
GOWN STRL REUS W/ TWL LRG LVL3 (GOWN DISPOSABLE) IMPLANT
GOWN STRL REUS W/TWL LRG LVL3 (GOWN DISPOSABLE) ×6
NS IRRIG 1000ML POUR BTL (IV SOLUTION) ×2 IMPLANT
PACK C SECTION (MISCELLANEOUS) ×1 IMPLANT
PAD OB MATERNITY 4.3X12.25 (PERSONAL CARE ITEMS) ×1 IMPLANT
PAD PREP 24X41 OB/GYN DISP (PERSONAL CARE ITEMS) ×1 IMPLANT
PENCIL SMOKE ULTRAEVAC 22 CON (MISCELLANEOUS) ×1 IMPLANT
SUT MNCRL 4-0 (SUTURE) ×2
SUT MNCRL 4-0 27XMFL (SUTURE) ×1
SUT PDS AB 1 TP1 96 (SUTURE) ×1 IMPLANT
SUT VIC AB 0 CTX 36 (SUTURE) ×4
SUT VIC AB 0 CTX36XBRD ANBCTRL (SUTURE) IMPLANT
SUTURE MNCRL 4-0 27XMF (SUTURE) IMPLANT

## 2019-07-23 NOTE — Lactation Note (Signed)
This note was copied from a baby's chart. Lactation Consultation Note  Patient Name: Boy Seraphina Mitchner [id number] Date: 07/23/2019   Mom requested LC to observe breast feed.  Demonstrated how to easily hand express colostrum.  Observed baby latching with minimal assistance and had strong, rhythmic sucking with occasional audible swallows.  Mom denies any breast or nipple pain.  Mom reports having lots of nipple pain with first baby and having to use a nipple shield and then 1st baby did not want to nurse without nipple shield.  Mom breast fed first for 11 months and then pumped for another month.  #24 nipple shield given, but discouraged mom from using unless absolutely necessary explaining risks of nipple shields.  Mom was concerned that baby had been spitting & had already had 4 bowel movements and was not even 12 hours old yet. Reassured mom that everything was perfectly normal. Parents had lots of questions which were addressed.  Mom thought she had to put the baby to the breast every 2 to 3 hours and had been waking him up.  Explained feeding cues and encouraged mom to put him to the breast whenever he demonstrated hunger cues.  Reviewed normal newborn stomach size, supply and demand, adequate intake and out put, when and how to start pumping for storage, when to start pacifier and/or bottle feeding so FOB could feed, normal course of lactation and routine newborn feeding patterns.  Lactation Government social research officer given with contact numbers and reviewed.  Lactation name and number written on white board and encouraged to call with any questions, concerns or assistance.    Maternal Data    Feeding    LATCH Score                   Interventions    Lactation Tools Discussed/Used     Consult Status      Louis Meckel 07/23/2019, 9:10 PM

## 2019-07-23 NOTE — Progress Notes (Signed)
Labor Check  Subj:  Complaints: feeling more pain, less comfortable now.  Ready for c-section   Obj:  BP 110/77 (BP Location: Right Arm)   Pulse 79   Temp 98.3 F (36.8 C) (Oral)   Resp 16   Ht 5\' 5"  (1.651 m)   Wt 92.5 kg   LMP 10/15/2018 (Exact Date)   SpO2 98%   BMI 33.95 kg/m  Dose (milli-units/min) Oxytocin: 0 milli-units/min (turn off per MD)  Cervix: Dilation: 8 / Effacement (%): 90 / Station: 0  Baseline FHR: 135 beats/min   Variability: moderate   Accelerations: present   Decelerations: absent Contractions: present frequency: 3-4 q 10 min (Adequate contractions for 9 hours) Overall assessment: cat 1  Female chaperone present for pelvic exam:   A/P: 29 y.o. G2P1001 female at [redacted]w[redacted]d with IOL, failed TOLAC..  1.  Labor: The patient has made little change over the past several hours with adequate MVUs for the past 9 hours.  While she has made a little change since 9 PM, she has only changed about 1 cm in the past 5 hours.  There is increase caput and the concern for prolonged pitocin administration on her uterine scar.  Her chance at progressing to completion is low and a discussion was held regarding the risk of continuing versus going for delivery. At this point, she would like to proceed with cesarean delivery and has a history of the same.  Will proceed with repeat LTCS.   2.  FWB: reassuring, Overall assessment: category 1  3.  GBS negative   [redacted]w[redacted]d, MD, Thomasene Mohair OB/GYN, [facility] 07/23/2019 5:00 AM

## 2019-07-23 NOTE — Anesthesia Procedure Notes (Signed)
Spinal  Patient location during procedure: OR Start time: 07/23/2019 5:36 AM End time: 07/23/2019 5:43 AM Staffing Performed: resident/CRNA  Anesthesiologist: Piscitello, Precious Haws, MD Resident/CRNA: Caryl Asp, CRNA Preanesthetic Checklist Completed: patient identified, IV checked, site marked, risks and benefits discussed, surgical consent, monitors and equipment checked, pre-op evaluation and timeout performed Spinal Block Patient position: sitting Prep: Betadine Patient monitoring: heart rate, continuous pulse ox, blood pressure and cardiac monitor Approach: midline Location: L4-5 Injection technique: single-shot Needle Needle type: Whitacre and Introducer  Needle gauge: 24 G Needle length: 9 cm Additional Notes Negative paresthesia. Negative blood return. Positive free-flowing CSF. Expiration date of kit checked and confirmed. Patient tolerated procedure well, without complications.

## 2019-07-23 NOTE — Op Note (Signed)
Cesarean Section Operative Note    Patient Name: Stacey Curtis  MRN: [id number]  Date of Surgery: 07/23/2019   Pre-operative Diagnosis:  1) Active phase arrest 2) Failed vaginal birth after cesarean delivery 3) History of cesarean delivery 4) intrauterine pregnancy at [redacted]w[redacted]d   Post-operative Diagnosis:  1) Active phase arrest 2) Failed vaginal birth after cesarean delivery 3) History of cesarean delivery 4) intrauterine pregnancy at [redacted]w[redacted]d   Procedure: Repeat low transverse cesarean section via Pfannenstiel incision with double layer uterine closure  Surgeon: Surgeon(s) and Role:    Will Bonnet, MD - Primary   Anesthesia: spinal   Findings:  1) normal appearing gravid uterus, fallopian tubes, and ovaries 2) viable female infant with APGARs 8 and 9, weight 3,780 grams   Quantified Blood Loss: 625 mL  Total IV Fluids: 800 ml   Urine Output: 375 mL clear urine at end of procedure  Specimens: none  Complications: no complications  Disposition: PACU - hemodynamically stable.   Maternal Condition: stable   Baby condition / location:  Couplet care / Skin to Skin  Procedure Details:  The patient was seen in the Holding Room. The risks, benefits, complications, treatment options, and expected outcomes were discussed with the patient. The patient concurred with the proposed plan, giving informed consent. identified as Stacey Curtis and the procedure verified as C-Section Delivery. A Time Out was held and the above information confirmed.   After induction of anesthesia, the patient was draped and prepped in the usual sterile manner. A Pfannenstiel incision was made and carried down through the subcutaneous tissue to the fascia. Fascial incision was made and extended transversely. The fascia was separated from the underlying rectus tissue superiorly and inferiorly. The peritoneum was identified and entered. Peritoneal incision was extended longitudinally.  The bladder flap was not bluntly or sharply freed from the lower uterine segment. A low transverse uterine incision was made and the hysterotomy was extended with cranial-caudal tension. Delivered from cephalic presentation was a 3,780 gram Living newborn infant(s) or Female with Apgar scores of 8 at one minute and 9 at five minutes. Cord ph was not sent the umbilical cord was clamped and cut cord blood was not obtained for evaluation. The placenta was removed Intact and appeared normal. She did have some adhesions of the left fallopian tube to the left anterior uterus. The uterine outline, tubes and ovaries appeared normal except as noted above. The uterine incision was closed with running locked sutures of 0 Vicryl.  A second layer of the same suture was thrown in an imbricating fashion.  Hemostasis was assured.  The uterus was returned to the abdomen and the paracolic gutters were cleared of all clots and debris.  The rectus muscles were inspected and found to be hemostatic.  The On-Q catheter pumps were inserted in accordance with the manufacturer's recommendations.  The catheters were inserted approximately 4cm cephelad to the incision line, approximately 1cm apart, straddling the midline.  They were inserted to a depth of the 4th mark. They were positioned superficial to the rectus abdominus muscles and deep to the rectus fascia.    The fascia was then reapproximated with running sutures of 1-0 PDS, looped. The subcutaneous closure was performed using 3-0 vicryl and was performed to reduce tension on the skin closure.  The subcuticular closure was performed using 4-0 monocryl. The skin closure was reinforced using benzoin and 1/2" steri-strips.  The On-Q catheters were bolused with 5 mL of 0.5% marcaine plain  for a total of 10 mL.  The catheters were affixed to the skin with surgical skin glue, steri-strips, and tegaderm.    Instrument, sponge, and needle counts were correct prior the abdominal closure  and were correct at the conclusion of the case.  The patient received Gentamicin, clindamycin, and azithromycin IV prior to skin incision (within 30 minutes). For VTE prophylaxis she was wearing SCDs throughout the case.    Signed: Conard Novak, MD 07/23/2019 6:59 AM

## 2019-07-23 NOTE — Transfer of Care (Signed)
Immediate Anesthesia Transfer of Care Note  Patient: Stacey Curtis  Procedure(s) Performed: CESAREAN SECTION (N/A )  Patient Location: Nursing Unit  Anesthesia Type:Spinal  Level of Consciousness: awake, alert  and oriented  Airway & Oxygen Therapy: Patient Spontanous Breathing  Post-op Assessment: Post -op Vital signs reviewed and stable  Post vital signs: stable  Last Vitals:  Vitals Value Taken Time  BP 108/73   Temp 98.1   Pulse 86   Resp 14   SpO2 100     Last Pain:  Vitals:   07/23/19 0412  TempSrc: Oral  PainSc:       Patients Stated Pain Goal: 0 (07/23/19 0219)  Complications: No complications documented.

## 2019-07-23 NOTE — Discharge Instructions (Signed)
Discharge Instructions:   Follow-up Appointment: Tuesday, July 6th at 10am with Dr. Jean Rosenthal at [facility]!   If there are any new medications, they have been ordered and will be available for pickup at the listed pharmacy on your way home from the hospital.   Call office if you have any of the following: headache, visual changes, fever >101.0 F, chills, shortness of breath, breast concerns, excessive vaginal bleeding, incision drainage or problems, leg pain or redness, depression or any other concerns. If you have vaginal discharge with an odor, let your doctor know.   It is normal to bleed for up to 6 weeks. You should not soak through more than 1 pad in 1 hour. If you have a blood clot larger than your fist with continued bleeding, call your doctor.   After a c-section, you should expect a small amount of blood or clear fluid coming from the incision and abdominal cramping/soreness. Inspect your incision site daily. Stand in front of a mirror to look for any redness, incision opening, or discolored/odorness drainage. Take a shower daily and continue good hygiene. Use own towel and washcloth (do not share). Make sure your sheets on your bed are clean. No pets sleeping around your incision site. Dressing will be removed at your postpartum visit. If the dressing does become wet or soiled underneath, it is okay to remove it.   On-Q pump: You will remove on day 5 after insertion or if the ball becomes flat before day 5. You will remove on: Saturday, July 3rd   Activity: Do not lift > 10 lbs for 6 weeks (do not lift anything heavier than your baby). No intercourse, tampons, swimming pools, hot tubs, baths (only showers) for 6 weeks.  No driving for 1-2 weeks. Continue prenatal vitamin, especially if breastfeeding. Increase calories and fluids (water) while breastfeeding.   Your milk will come in, in the next couple of days (right now it is colostrum). You may have a slight fever when your milk  comes in, but it should go away on its own.  If it does not, and rises above 101 F please call the doctor. You will also feel achy and your breasts will be firm. They will also start to leak. If you are breastfeeding, continue as you have been and you can pump/express milk for comfort.   If you have too much milk, your breasts can become engorged, which could lead to mastitis. This is an infection of the milk ducts. It can be very painful and you will need to notify your doctor to obtain a prescription for antibiotics. You can also treat it with a shower or hot/cold compress.   For concerns about your baby, please call your pediatrician.  For breastfeeding concerns, the lactation consultant can be reached at [phone].   Postpartum blues (feelings of happy one minute and sad another minute) are normal for the first few weeks but if it gets worse let your doctor know.   Congratulations! We enjoyed caring for you and your new bundle of joy!

## 2019-07-23 NOTE — Discharge Summary (Signed)
Postpartum Discharge Summary  Patient Name: Stacey Curtis DOB: 01-28-1990 MRN: [id number]  Date of admission: 07/22/2019 Delivery date:07/23/2019  Delivering provider: Prentice Docker D  Date of discharge: 07/23/2019  Admitting diagnosis: Encounter for induction of labor [Z34.90] Intrauterine pregnancy: [redacted]w[redacted]d    Secondary diagnosis:  Active Problems:   Supervision of high risk pregnancy, antepartum   History of cesarean delivery   Short interval between pregnancies affecting pregnancy, antepartum   Encounter for induction of labor  Additional problems: none    Discharge diagnosis: Term Pregnancy Delivered                                              Post partum procedures:none Augmentation: AROM, Pitocin and IP Foley Complications: None  Hospital course: Induction of Labor With Cesarean Section   29y.o. yo G2P2002 at [id number] admitted to the hospital 07/22/2019 for induction of labor. Patient had a labor course significant for active phase arrest at about 8 cm. The patient went for cesarean section due to Arrest of Dilation. Delivery details are as follows: Membrane Rupture Time/Date: 6:07 AM ,07/23/2019   Delivery Method:C-Section, Low Transverse  Details of operation can be found in separate operative Note.  Patient had an uncomplicated postpartum course. She is ambulating, tolerating a regular diet, passing flatus, and urinating well.  Patient is discharged home in stable condition on 07/24/19.      Newborn Data: Birth date:07/23/2019  Birth time:6:09 AM  Gender:Female  Living status:Living  Apgars:8 ,9  Weight:                                 Magnesium Sulfate received: No BMZ received: No Rhophylac:No MMR:No T-DaP: antepartum 05/22/2019 Transfusion:No  Physical exam  Vitals:   07/24/19 0100 07/24/19 0300 07/24/19 0829 07/24/19 1622  BP:   99/68 117/77  Pulse:   82 86  Resp:   18 18  Temp:   98 F (36.7 C) 97.8 F (36.6 C)  TempSrc:   Oral Oral    SpO2: 98% 94% 97%   Weight:      Height:       General: alert, cooperative and no distress Lochia: appropriate Uterine Fundus: firm Incision: Healing well with no significant drainage, No significant erythema, Dressing is clean, dry, and intact DVT Evaluation: No evidence of DVT seen on physical exam. Negative Homan's sign. Labs: Lab Results  Component Value Date   WBC 9.6 07/24/2019   HGB 8.9 (L) 07/24/2019   HCT 26.3 (L) 07/24/2019   MCV 85.4 07/24/2019   PLT 180 07/24/2019   CMP Latest Ref Rng & Units 09/05/2012  Glucose 70 - 99 mg/dL 97  BUN 6 - 23 mg/dL 11  Creatinine 0.40 - 1.20 mg/dL 0.8  Sodium 135 - 145 mEq/L 137  Potassium 3.5 - 5.1 mEq/L 4.0  Chloride 96 - 112 mEq/L 105  CO2 19 - 32 mEq/L 24  Calcium 8.4 - 10.5 mg/dL 9.4  Total Protein 6.0 - 8.3 g/dL 7.6  Total Bilirubin 0.3 - 1.2 mg/dL 0.6  Alkaline Phos 39 - 117 U/L 43  AST 0 - 37 U/L 17  ALT 0 - 35 U/L 12   Edinburgh Score: Edinburgh Postnatal Depression Scale Screening Tool 07/23/2019  I have been able to laugh and see the  funny side of things. (No Data)  I have looked forward with enjoyment to things. -  I have blamed myself unnecessarily when things went wrong. -  I have been anxious or worried for no good reason. -  I have felt scared or panicky for no good reason. -  Things have been getting on top of me. -  I have been so unhappy that I have had difficulty sleeping. -  I have felt sad or miserable. -  I have been so unhappy that I have been crying. -  The thought of harming myself has occurred to me. Flavia Shipper Postnatal Depression Scale Total -      After visit meds:  Allergies as of 07/24/2019      Reactions   Cephalexin Anaphylaxis   Sulfa Antibiotics Anaphylaxis      Medication List    STOP taking these medications   doxylamine (Sleep) 25 MG tablet Commonly known as: UNISOM     TAKE these medications   oxyCODONE-acetaminophen 5-325 MG tablet Commonly known as:  PERCOCET/ROXICET Take 1 tablet by mouth every 4 (four) hours as needed for moderate pain (pain score 4-7/10).   PRENATAL VITAMIN PO Take 2 tablets by mouth at bedtime.            Discharge Care Instructions  (From admission, onward)         Start     Ordered   07/24/19 0000  If the dressing is still on your incision site when you go home, remove it on the third day after your surgery date. Remove dressing if it begins to fall off, or if it is dirty or damaged before the third day.        07/24/19 1717           Discharge home in stable condition Infant Feeding: Breast Infant Disposition:home with mother Discharge instruction: per After Visit Summary and Postpartum booklet. Activity: Advance as tolerated. Pelvic rest for 6 weeks.  Diet: routine diet Anticipated Birth Control: POPs Postpartum Appointment:1 week Future Appointments: Future Appointments  Date Time Provider [place]  07/31/2019 10:00 AM Will Bonnet, MD [facility] None   Follow up Visit:  Follow-up Information    Will Bonnet, MD. Go on 07/31/2019.   Specialty: Obstetrics and Gynecology Why: Postpartum follow-up for incision check: Tuesday, July 6th at [facility] information: [address] [place] [address] [phone]               SIGNED: Barnett Applebaum, MD, [facility], [facility] 07/24/2019  5:18 PM

## 2019-07-24 ENCOUNTER — Encounter: Payer: Self-pay | Admitting: Obstetrics and Gynecology

## 2019-07-24 ENCOUNTER — Other Ambulatory Visit: Payer: 59

## 2019-07-24 LAB — CBC
HCT: 26.3 % — ABNORMAL LOW (ref 36.0–46.0)
Hemoglobin: 8.9 g/dL — ABNORMAL LOW (ref 12.0–15.0)
MCH: 28.9 pg (ref 26.0–34.0)
MCHC: 33.8 g/dL (ref 30.0–36.0)
MCV: 85.4 fL (ref 80.0–100.0)
Platelets: 180 10*3/uL (ref 150–400)
RBC: 3.08 MIL/uL — ABNORMAL LOW (ref 3.87–5.11)
RDW: 13.2 % (ref 11.5–15.5)
WBC: 9.6 10*3/uL (ref 4.0–10.5)
nRBC: 0 % (ref 0.0–0.2)

## 2019-07-24 MED ORDER — OXYCODONE-ACETAMINOPHEN 5-325 MG PO TABS: 1.0000 | ORAL_TABLET | ORAL | 0 refills | Status: DC | PRN

## 2019-07-24 MED ORDER — ACETAMINOPHEN 500 MG PO TABS: 1000.0000 mg | ORAL_TABLET | Freq: Four times a day (QID) | ORAL | Status: DC | PRN

## 2019-07-24 MED ORDER — IBUPROFEN 600 MG PO TABS
600.0000 mg | ORAL_TABLET | Freq: Four times a day (QID) | ORAL | Status: DC
  Administered 2019-07-24 (×2): 600 mg via ORAL
  Filled 2019-07-24 (×2): qty 1

## 2019-07-24 NOTE — Anesthesia Postprocedure Evaluation (Signed)
Anesthesia Post Note  Patient: Stacey Curtis  Procedure(s) Performed: CESAREAN SECTION (N/A )  Patient location during evaluation: Mother Baby Anesthesia Type: Epidural and Spinal Level of consciousness: awake and alert Pain management: pain level controlled Vital Signs Assessment: post-procedure vital signs reviewed and stable Respiratory status: spontaneous breathing, nonlabored ventilation and respiratory function stable Cardiovascular status: stable Postop Assessment: no headache, no backache, epidural receding and able to ambulate Anesthetic complications: no   No complications documented.   Last Vitals:  Vitals:   07/24/19 0100 07/24/19 0300  BP:    Pulse:    Resp:    Temp:    SpO2: 98% 94%    Last Pain:  Vitals:   07/23/19 2331  TempSrc: Oral  PainSc:                  Rosanne Gutting

## 2019-07-24 NOTE — Progress Notes (Signed)
All discharge teaching completed; pt being discharged home with husband and new baby

## 2019-07-24 NOTE — Lactation Note (Signed)
This note was copied from a baby's chart. Lactation Consultation Note  Patient Name: Boy Mattia Osterman [id number] Date: 07/24/2019 Reason for consult: Follow-up assessment;Term  LC follow-up with parents before potential discharge after 4pm. Mom reports one short feeding since circumcision, but feels baby may be getting ready to eat again. Mom independently got back into the bed, used support pillows to get baby positioned well and herself comfortable. Mom sandwiched her breast tissue, nipple appeared flat at first, baby grasped breast easily and began strong rhythmic sucking pattern, mom reporting no pain/discomfort. Audible swallows were heard throughout the feeding, along with deepening of the chin; both reassuring to mom. Baby fed continuously for 9 minutes before coming off on his own; appearing content. Encouraged to continue feeding with cues, tracking output, and baby's body language before/after feedings. Breast and nipple care education given, educated on difference between breast fullness and engorgement, and signs and symptoms of plugged ducts and mastitis. Information given for outpatient lactation services and community breastfeeding support.  Maternal Data Formula Feeding for Exclusion: No Has patient been taught Hand Expression?: Yes Does the patient have breastfeeding experience prior to this delivery?: Yes  Feeding Feeding Type: Breast Fed  LATCH Score Latch: Grasps breast easily, tongue down, lips flanged, rhythmical sucking.  Audible Swallowing: Spontaneous and intermittent  Type of Nipple: Flat (baby draws nipple out)  Comfort (Breast/Nipple): Soft / non-tender (per mom)  Hold (Positioning): No assistance needed to correctly position infant at breast.  LATCH Score: 9  Interventions Interventions: Breast feeding basics reviewed (observed feeding)  Lactation Tools Discussed/Used     Consult Status Consult Status: Complete    Danford Bad 07/24/2019,  2:17 PM

## 2019-07-24 NOTE — Plan of Care (Signed)
Pt discharged at [facility]

## 2019-07-24 NOTE — Progress Notes (Signed)
Obstetric Postpartum/PostOperative Daily Progress Note Subjective:  29 y.o. [id number] post-operative day # 1 status post repeat cesarean delivery.  She is ambulating, is tolerating po, is voiding spontaneously.  Her pain is well controlled on PO pain medications. Her lochia is less than menses.   Medications SCHEDULED MEDICATIONS  . ferrous sulfate  325 mg Oral BID WC  . ibuprofen  600 mg Oral Q6H  . prenatal multivitamin  1 tablet Oral Q1200  . senna-docusate  2 tablet Oral Q24H  . simethicone  80 mg Oral TID PC    MEDICATION INFUSIONS  . lactated ringers    . naLOXone (NARCAN) adult infusion for PRURITIS      PRN MEDICATIONS  coconut oil, witch hazel-glycerin **AND** dibucaine, diphenhydrAMINE **OR** diphenhydrAMINE, diphenhydrAMINE, menthol-cetylpyridinium, nalbuphine **OR** nalbuphine, nalbuphine **OR** nalbuphine, naloxone **AND** sodium chloride flush, naLOXone (NARCAN) adult infusion for PRURITIS, ondansetron (ZOFRAN) IV, oxyCODONE-acetaminophen **OR** oxyCODONE-acetaminophen, zolpidem    Objective:   Vitals:   07/23/19 2100 07/23/19 2331 07/24/19 0100 07/24/19 0300  BP:  107/66    Pulse:  97    Resp:  18    Temp:  98.6 F (37 C)    TempSrc:  Oral    SpO2: 98% 97% 98% 94%  Weight:      Height:        Current Vital Signs 24h Vital Sign Ranges  T 98.6 F (37 C) Temp  Avg: 98.3 F (36.8 C)  Min: 97.5 F (36.4 C)  Max: 98.6 F (37 C)  BP 107/66 BP  Min: 96/75  Max: 111/78  HR 97 Pulse  Avg: 81.1  Min: 72  Max: 97  RR 18 Resp  Avg: 18.7  Min: 16  Max: 24  SaO2 94 %  (Room Air) SpO2  Avg: 97.9 %  Min: 94 %  Max: 100 %       24 Hour I/O Current Shift I/O  Time Ins Outs 06/28 0701 - 06/29 0700 In: 1302.1 [P.O.:640; I.V.:662.1] Out: 6115 [Urine:6000] No intake/output data recorded.  General: NAD Pulmonary: no increased work of breathing Abdomen: non-distended, non-tender, fundus firm at level of umbilicus Inc: Clean/dry/intact Extremities: no edema, no erythema, no  tenderness  Labs:  Recent Labs  Lab 07/22/19 0124 07/24/19 0547  WBC 8.1 9.6  HGB 11.3* 8.9*  HCT 32.6* 26.3*  PLT 227 180     Assessment:   28 y.o. [id number] postoperative day # 1 status post repeat cesarean section after failed attempt at [facility].  Plan:  1) Acute blood loss anemia - hemodynamically stable and asymptomatic - po ferrous sulfate  2) A POS / Rubella 8.16 (12/01 1454)/ Varicella Immune  3) TDAP status: received 05/22/2019  4) breast feeding /Contraception = unsure. Considering POPs  5) Disposition: home POD#2-3.  Conard Novak, MD, FACOG 07/24/2019 7:41 AM

## 2019-07-24 NOTE — Lactation Note (Signed)
This note was copied from a baby's chart. Lactation Consultation Note  Patient Name: Boy Dannielle Baskins [id number] Date: 07/24/2019 Reason for consult: Follow-up assessment;Mother's request;Term  LC in to follow-up. Ford had circumcision this morning, and is sleepy; mom concerned about him not being interested in feeding at this time. LC provided reassurance with baby's behavior following circ, encouraged rest, skin to skin, and offering periodically until baby is ready to eat.  Mom had questions re: feeding and returning to work. Questions addressed.  Today, parents encouraged to continue feeding with cues, monitoring number of wet/stool diapers, spend time skin to skin, and reviewed newborn stomach size and feeding patterns. Mom feeling confident with feedings, and feels comfortable calling out with ongoing support.  Maternal Data Formula Feeding for Exclusion: No Has patient been taught Hand Expression?: Yes Does the patient have breastfeeding experience prior to this delivery?: Yes  Feeding Feeding Type: Breast Fed  LATCH Score                   Interventions Interventions: Breast feeding basics reviewed  Lactation Tools Discussed/Used     Consult Status Consult Status: Complete    Danford Bad 07/24/2019, 11:07 AM

## 2019-07-26 ENCOUNTER — Inpatient Hospital Stay: Admit: 2019-07-26 | Payer: 59 | Admitting: Obstetrics and Gynecology

## 2019-07-26 ENCOUNTER — Ambulatory Visit (INDEPENDENT_AMBULATORY_CARE_PROVIDER_SITE_OTHER): Payer: 59 | Admitting: Certified Nurse Midwife

## 2019-07-26 ENCOUNTER — Encounter: Payer: Self-pay | Admitting: Certified Nurse Midwife

## 2019-07-26 ENCOUNTER — Telehealth: Payer: Self-pay | Admitting: Certified Nurse Midwife

## 2019-07-26 ENCOUNTER — Other Ambulatory Visit: Payer: Self-pay | Admitting: Certified Nurse Midwife

## 2019-07-26 ENCOUNTER — Other Ambulatory Visit: Payer: Self-pay

## 2019-07-26 DIAGNOSIS — O99345 Other mental disorders complicating the puerperium: Secondary | ICD-10-CM

## 2019-07-26 DIAGNOSIS — F53 Postpartum depression: Secondary | ICD-10-CM

## 2019-07-26 DIAGNOSIS — F418 Other specified anxiety disorders: Secondary | ICD-10-CM

## 2019-07-26 SURGERY — Surgical Case
Anesthesia: Choice

## 2019-07-26 MED ORDER — ESCITALOPRAM OXALATE 10 MG PO TABS: 10.0000 mg | ORAL_TABLET | Freq: Every day | ORAL | 1 refills | Status: DC

## 2019-07-26 MED ORDER — HYDROXYZINE HCL 25 MG PO TABS
25.0000 mg | ORAL_TABLET | Freq: Four times a day (QID) | ORAL | 2 refills | Status: AC | PRN
Start: 2019-07-26 — End: ?

## 2019-07-27 ENCOUNTER — Encounter: Payer: Self-pay | Admitting: Certified Nurse Midwife

## 2019-07-27 DIAGNOSIS — F418 Other specified anxiety disorders: Secondary | ICD-10-CM | POA: Insufficient documentation

## 2019-07-27 NOTE — Progress Notes (Signed)
Virtual Visit via Telephone Note  I connected with Stacey Curtis on 07/27/19 at 11:10 AM EDT by telephone and verified that I am speaking with the correct person using two identifiers.   I discussed the limitations, risks, security and privacy concerns of performing an evaluation and management service by telephone and the availability of in person appointments. I also discussed with the patient that there may be a patient responsible charge related to this service. The patient expressed understanding and agreed to proceed.  The patient was at home I spoke with the patient from my  Work station [employer] of people involved in this encounter were: TheresaCMA  , and Adley , and myself .   History of Present Illness: 29 year old G2 P2002, now 3 days postop Cesarean section for unsuccessful VBAC, who called the office wanting to talk to a provider, crying during much of the call. She just was discharged last night, got about 4 hours sleep, little sleep for days before that. She expressed her increased anxiety regarding concerns for not being able to spend enough time with her first child who is 63 mos old because of her needing to care for and breast feed her second child. She and her husband decided to stop breast feeding and feed baby formula. Has applied cabbage leaves to her breasts. She feels that her first child "hates" his new brother. Is worried that she won't love her second child like she loves her first child.    Observations/Objective: Physical Exam could not be performed. Because of the COVID-19 outbreak this visit was performed over the phone and not in person.  Shelvie has a history of anxiety and depression and was taking Lexapro when she conceived with her second child. She stopped taking the Lexapro with her positive pregnancy test.  Inocente Salles Postnatal Depression Scale - 07/26/19 1111      Edinburgh Postnatal Depression Scale:  In the Past 7 Days   I have been able  to laugh and see the funny side of things. 0    I have looked forward with enjoyment to things. 3    I have blamed myself unnecessarily when things went wrong. 2    I have been anxious or worried for no good reason. 3    I have felt scared or panicky for no good reason. 3    Things have been getting on top of me. 3    I have been so unhappy that I have had difficulty sleeping. 2    I have felt sad or miserable. 2    I have been so unhappy that I have been crying. 2    The thought of harming myself has occurred to me. 0    Edinburgh Postnatal Depression Scale Total 20         Assessment/ Plan: Postpartum anxiety and depression Discussed restarting Lexapro 10 mgm daily. Atarax 25 mgm every 6 hours prn anxiety Discussed with patient decision to stop breast feeding-decided to pump enough to relieve discomfort-encouraged to store breast milk she pumps and feed to baby.  Recommended getting sleep when she can-husband is supporting her as well as family members.  Recommended having time set aside to cuddle and play with first child daily RTO 6 July as scheduled for incision check and follow up.    I discussed the assessment and treatment plan with the patient. The patient was provided an opportunity to ask questions and all were answered. The patient agreed with the  plan and demonstrated an understanding of the instructions.   The patient was advised to call back or seek an in-person evaluation if the symptoms worsen or if the condition fails to improve as anticipated.  I provided 20 minutes of non-face-to-face time during this encounter.  Farrel Conners, CNM [facility], [facility]

## 2019-07-31 ENCOUNTER — Encounter: Payer: Self-pay | Admitting: Obstetrics and Gynecology

## 2019-07-31 ENCOUNTER — Other Ambulatory Visit: Payer: Self-pay

## 2019-07-31 ENCOUNTER — Ambulatory Visit (INDEPENDENT_AMBULATORY_CARE_PROVIDER_SITE_OTHER): Payer: 59 | Admitting: Obstetrics and Gynecology

## 2019-07-31 VITALS — BP 118/74 | Wt 180.0 lb

## 2019-07-31 DIAGNOSIS — Z09 Encounter for follow-up examination after completed treatment for conditions other than malignant neoplasm: Secondary | ICD-10-CM

## 2019-07-31 NOTE — Progress Notes (Signed)
° °  Postoperative Follow-up Patient presents post op from cesarean section  1 week ago.  Subjective: She denies fever, chills, nausea and vomiting. Eating a regular diet without difficulty. The patient is not having any pain.  Activity: increasing slowly. She denies issues with her incision.   She was seen virtually on 7/1 for anxiety/depression. She was started on Lexapro 10 mg and hydroxizine 25 mg Q 6 h prn anxiety.  She is much improved.  Her EPDS is 15 today.  Objective: BP 118/74    Wt 180 lb (81.6 kg)    BMI 29.95 kg/m   Constitutional: Well nourished, well developed female in no acute distress.  HEENT: normal Skin: Warm and dry.  Abdomen: Soft, non-tender, nd clean, dry, intact and without erythema, induration, warmth, and tenderness Extremity: no edema   Assessment: 29 y.o. s/p cesarean section progressing well  Plan: Patient has done well after surgery with no apparent complications.  I have discussed the post-operative course to date, and the expected progress moving forward.  The patient understands what complications to be concerned about.    Activity plan: increase slowly Incision care discussed Plans for condoms.   Return in about 5 weeks (around 09/04/2019) for Six Week Postpartum.  Thomasene Mohair, MD 07/31/2019 10:37 AM

## 2019-08-27 ENCOUNTER — Other Ambulatory Visit: Payer: Self-pay

## 2019-08-27 ENCOUNTER — Encounter: Payer: Self-pay | Admitting: Obstetrics and Gynecology

## 2019-08-27 ENCOUNTER — Ambulatory Visit (INDEPENDENT_AMBULATORY_CARE_PROVIDER_SITE_OTHER): Payer: 59 | Admitting: Obstetrics and Gynecology

## 2019-08-27 NOTE — Progress Notes (Signed)
Postpartum Visit   Chief Complaint  Patient presents with  . Postpartum Care  . Follow-up    History of Present Illness: Patient is a 29 y.o. [id number] presents for postpartum visit.  Date of delivery: 07/23/2019  Type of delivery: C-Section Episiotomy no Laceration: no  Pregnancy or labor problems:  no Any problems since the delivery:  no  Newborn Details:  SINGLETON :  1. Baby's name: Ford. Birth weight: 8.5lb Maternal Details:  Breast Feeding:  yes Post partum depression/anxiety noted:  Anxiety, on Lexapro Edinburgh Post-Partum Depression Score:  14 (states she is doing well. Denies SI/HI). States she is actually doing better.   Date of last PAP: 04/2017, NILM, hpv not done due to age.   Past Medical History:  Diagnosis Date  . Anxiety and depression   . Common migraine   . Nephrolithiasis   . 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 Recurrent cystitis     Past Surgical History:  Procedure Laterality Date  . CESAREAN SECTION N/A 02/22/2018   Procedure: CESAREAN SECTION;  Surgeon: [name], MD;  Location: [facility] ORS;  Service: Obstetrics;  Laterality: N/A;  . TONSILLECTOMY  2011    Prior to Admission medications   Medication Sig Start Date End Date Taking? Authorizing Provider  escitalopram (LEXAPRO) 10 MG tablet Take 1 tablet (10 mg total) by mouth daily. 07/26/19  Yes Farrel Conners, CNM  hydrOXYzine (ATARAX/VISTARIL) 25 MG tablet Take 1 tablet (25 mg total) by mouth every 6 (six) hours as needed for anxiety. 07/26/19  Yes Farrel Conners, CNM  Prenatal Vit-Fe Fumarate-FA (PRENATAL VITAMIN PO) Take 2 tablets by mouth at bedtime.    Yes [provider]    Allergies  Allergen Reactions  . Cephalexin Anaphylaxis  . Sulfa Antibiotics Anaphylaxis     Social History   Socioeconomic History  . Marital status: Married    Spouse name: Bernette Mayers  . Number of children: 0  . Years of education: Not  on file  . Highest education level: Not on file  Occupational History  . Occupation: Gaffer    Comment: [employer]  . Occupation: Passenger transport manager  Tobacco Use  . Smoking status: Never Smoker  . Smokeless tobacco: Never Used  Vaping Use  . Vaping Use: Never used  Substance and Sexual Activity  . Alcohol use: Yes    Comment: occasional  . Drug use: No  . Sexual activity: Not Currently    Partners: Male    Birth control/protection: None  Other Topics Concern  . Not on file  Social History Narrative   Starting at [employer] for [employer] (fall 2015)               Social Determinants of Health   Financial Resource Strain:   . Difficulty of Paying Living Expenses:   Food Insecurity:   . Worried About Programme researcher, broadcasting/film/video in the Last Year:   . Barista in the Last Year:   Transportation Needs:   . Freight forwarder (Medical):   Marland Kitchen Lack of Transportation (Non-Medical):   Physical Activity:   . Days of Exercise per Week:   . Minutes of Exercise per Session:   Stress:   . Feeling of Stress :   Social Connections:   . Frequency of Communication with Friends and Family:   . Frequency of Social Gatherings with Friends and Family:   . Attends Religious Services:   .  Active Member of Clubs or Organizations:   . Attends Banker Meetings:   Marland Kitchen Marital Status:   Intimate Partner Violence:   . Fear of Current or Ex-Partner:   . Emotionally Abused:   Marland Kitchen Physically Abused:   . Sexually Abused:     Family History  Problem Relation Age of Onset  . Cardiomyopathy Father   . Cardiomyopathy Paternal Grandfather   . Melanoma Paternal Grandmother   . Cerebral palsy Cousin   . Diabetes Neg Hx   . Hypertension Neg Hx   . Stroke Neg Hx   . Thyroid disease Neg Hx     Review of Systems  Constitutional: Negative.   HENT: Negative.   Eyes: Negative.   Respiratory: Negative.   Cardiovascular: Negative.   Gastrointestinal: Negative.     Genitourinary: Negative.   Musculoskeletal: Negative.   Skin: Negative.   Neurological: Negative.   Psychiatric/Behavioral: Negative.      Physical Exam BP 122/74   Ht 5\' 5"  (1.651 m)   Wt 176 lb (79.8 kg)   BMI 29.29 kg/m   Physical Exam Constitutional:      General: She is not in acute distress.    Appearance: Normal appearance. She is well-developed.  Genitourinary:     Pelvic exam was performed with patient in the lithotomy position.     Vulva, inguinal canal, urethra, bladder, vagina, uterus, right adnexa and left adnexa normal.     No posterior fourchette tenderness, injury or lesion present.     No cervical friability, lesion, bleeding or polyp.  HENT:     Head: Normocephalic and atraumatic.  Eyes:     General: No scleral icterus.    Conjunctiva/sclera: Conjunctivae normal.  Cardiovascular:     Rate and Rhythm: Normal rate and regular rhythm.     Heart sounds: No murmur heard.  No friction rub. No gallop.   Pulmonary:     Effort: Pulmonary effort is normal. No respiratory distress.     Breath sounds: Normal breath sounds. No wheezing or rales.  Abdominal:     General: Bowel sounds are normal. There is no distension.     Palpations: Abdomen is soft. There is no mass.     Tenderness: There is no abdominal tenderness. There is no guarding or rebound.     Comments: without erythema, induration, warmth, and tenderness. It is clean, dry, and intact.    Musculoskeletal:        General: Normal range of motion.     Cervical back: Normal range of motion and neck supple.  Neurological:     General: No focal deficit present.     Mental Status: She is alert and oriented to person, place, and time.     Cranial Nerves: No cranial nerve deficit.  Skin:    General: Skin is warm and dry.     Findings: No erythema.  Psychiatric:        Mood and Affect: Mood normal.        Behavior: Behavior normal.        Judgment: Judgment normal.      Female Chaperone present during  breast and/or pelvic exam.  Assessment: 29 y.o. 26 presenting for 6 week postpartum visit  Plan: Problem List Items Addressed This Visit    None    Visit Diagnoses    Postpartum care and examination    -  Primary       1) Contraception Education given regarding options for contraception, including barrier  for now. Marland Kitchen  2)  Pap - ASCCP guidelines and rational discussed.  Patient opts for routine screening interval. Due in one year.  3) Patient underwent screening for postpartum depression with no concerns noted with current medication. Discussed contacting me, if her symptoms get worse.   4) Follow up 1 year for routine annual exam  Thomasene Mohair, MD 08/27/2019 12:43 PM

## 2019-08-31 ENCOUNTER — Encounter: Payer: Self-pay | Admitting: Internal Medicine

## 2019-08-31 ENCOUNTER — Telehealth (INDEPENDENT_AMBULATORY_CARE_PROVIDER_SITE_OTHER): Payer: 59 | Admitting: Internal Medicine

## 2019-08-31 VITALS — Temp 98.6°F

## 2019-08-31 DIAGNOSIS — J0100 Acute maxillary sinusitis, unspecified: Secondary | ICD-10-CM

## 2019-08-31 MED ORDER — AMOXICILLIN 500 MG PO CAPS
500.0000 mg | ORAL_CAPSULE | Freq: Three times a day (TID) | ORAL | 0 refills | Status: DC
Start: 2019-08-31 — End: 2019-12-11

## 2019-08-31 NOTE — Patient Instructions (Signed)

## 2019-08-31 NOTE — Progress Notes (Signed)
Virtual Visit via Video Note  I connected with Stacey Curtis on 08/31/19 at  3:00 PM EDT by a video enabled telemedicine application and verified that I am speaking with the correct person using two identifiers.  Location: Patient:  In her car Provider: Office   I discussed the limitations of evaluation and management by telemedicine and the availability of in person appointments. The patient expressed understanding and agreed to proceed.  HPI  Patient reports headache, right-sided facial pain and pressure, nasal congestion, sore throat and cough.  She reports this started 1/2 weeks ago.  The headache was located in her forehead, but that has since resolved.  She is blowing clear/dark green mucus out of her nose.  She denies difficulty swallowing.  The cough is nonproductive. She denies eye pain, eye redness, visual changes, dizziness, runny nose, ear pain, loss of taste/smell or SOB.  She denies fever, chills or body aches but reports she has not felt this bad in a long time.  She reports history of sinus infection and reports this feels the same.  She has had sick contacts with HFM disease but she is not having any of the symptoms.  She has taken Flonase OTC with minimal relief of symptoms.  She is currently breast-feeding.  Review of Systems     Past Medical History:  Diagnosis Date  . Anxiety and depression   . Common migraine   . Nephrolithiasis   . 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 Recurrent cystitis     Family History  Problem Relation Age of Onset  . Cardiomyopathy Father   . Cardiomyopathy Paternal Grandfather   . Melanoma Paternal Grandmother   . Cerebral palsy Cousin   . Diabetes Neg Hx   . Hypertension Neg Hx   . Stroke Neg Hx   . Thyroid disease Neg Hx     Social History   Socioeconomic History  . Marital status: Married    Spouse name: Bernette Mayers  . Number of children: 0  . Years of education:  Not on file  . Highest education level: Not on file  Occupational History  . Occupation: Gaffer    Comment: [employer]  . Occupation: Passenger transport manager  Tobacco Use  . Smoking status: Never Smoker  . Smokeless tobacco: Never Used  Vaping Use  . Vaping Use: Never used  Substance and Sexual Activity  . Alcohol use: Yes    Comment: occasional  . Drug use: No  . Sexual activity: Not Currently    Partners: Male    Birth control/protection: None  Other Topics Concern  . Not on file  Social History Narrative   Starting at [employer] for [employer] (fall 2015)               Social Determinants of Health   Financial Resource Strain:   . Difficulty of Paying Living Expenses:   Food Insecurity:   . Worried About Programme researcher, broadcasting/film/video in the Last Year:   . Barista in the Last Year:   Transportation Needs:   . Freight forwarder (Medical):   Marland Kitchen Lack of Transportation (Non-Medical):   Physical Activity:   . Days of Exercise per Week:   . Minutes of Exercise per Session:   Stress:   . Feeling of Stress :   Social Connections:   . Frequency of Communication with Friends and Family:   . Frequency  of Social Gatherings with Friends and Family:   . Attends Religious Services:   . Active Member of Clubs or Organizations:   . Attends Banker Meetings:   Marland Kitchen Marital Status:   Intimate Partner Violence:   . Fear of Current or Ex-Partner:   . Emotionally Abused:   Marland Kitchen Physically Abused:   . Sexually Abused:     Allergies  Allergen Reactions  . Cephalexin Anaphylaxis  . Sulfa Antibiotics Anaphylaxis     Constitutional: Positive headache, fatigue. Denies fever abrupt weight changes.  HEENT:  Positive facial pain, nasal congestion and sore throat. Denies eye redness, ear pain, ringing in the ears, wax buildup, runny nose or bloody nose. Respiratory: Positive cough. Denies difficulty breathing or shortness of breath.  Cardiovascular: Denies chest  pain, chest tightness, palpitations or swelling in the hands or feet.   No other specific complaints in a complete review of systems (except as listed in HPI above).  Objective:   There were no vitals taken for this visit.  General: Appears her stated age, appears unwell but in NAD. HEENT: Head: normal shape and size, right maxillary sinus tenderness noted; Nose: Congestion noted; Throat/Mouth: No hoarseness noted Neck:  No adenopathy noted.  Pulmonary/Chest: Normal effort. No respiratory distress.       Assessment & Plan:   Acute Maxillary Sinusitis  Can use a Neti Pot which can be purchased from your local drug store. Flonase 2 sprays each nostril for 3 days and then as needed. eRx for Amoxil TID for 10 days  RTC as needed or if symptoms persist. Nicki Reaper, NP   Follow Up Instructions:    I discussed the assessment and treatment plan with the patient. The patient was provided an opportunity to ask questions and all were answered. The patient agreed with the plan and demonstrated an understanding of the instructions.   The patient was advised to call back or seek an in-person evaluation if the symptoms worsen or if the condition fails to improve as anticipated.    Nicki Reaper, NP

## 2019-09-05 ENCOUNTER — Telehealth: Payer: Self-pay | Admitting: Internal Medicine

## 2019-09-05 MED ORDER — PREDNISONE 10 MG PO TABS: 10.0000 mg | ORAL_TABLET | Freq: Every day | ORAL | 0 refills | Status: AC

## 2019-09-05 NOTE — Telephone Encounter (Signed)
Spoke with pt  She stated its ok to call in oral steroids  But she has questions about taking medication and breast feeding .  Is this ok for the baby and will it effect milk production.  Baby is 62 weeks old  [facility] [address] [facility] [place] number [phone]  Please advise

## 2019-09-05 NOTE — Telephone Encounter (Signed)
Will not affect mild production but does pass through the breast milk. It should not cause any major issues in the baby as we are using the lowest dose to treat.

## 2019-09-05 NOTE — Telephone Encounter (Signed)
Pt called in and said she is still not better. She had a virtual visit with Rene Kocher on 08/31/19 and was told if she wasn't better to give a call back and something else would be called in. She needs Rene Kocher to know that she is breast feeding so please keep that in mind when sending something in. She wanted to do another virtual today but we are completely full.

## 2019-09-05 NOTE — Addendum Note (Signed)
Addended by: Lorre Munroe on: 09/05/2019 03:39 PM   Modules accepted: Orders

## 2019-09-05 NOTE — Telephone Encounter (Signed)
Pt is aware as instructed and expressed understanding 

## 2019-09-05 NOTE — Telephone Encounter (Signed)
We could try some oral steroids. Let me know if she is agreeable.

## 2019-09-23 ENCOUNTER — Other Ambulatory Visit: Payer: Self-pay | Admitting: Certified Nurse Midwife

## 2019-11-29 ENCOUNTER — Other Ambulatory Visit: Payer: Self-pay | Admitting: Obstetrics and Gynecology

## 2019-11-29 ENCOUNTER — Telehealth: Payer: Self-pay

## 2019-11-29 DIAGNOSIS — F32A Depression, unspecified: Secondary | ICD-10-CM

## 2019-11-29 DIAGNOSIS — F419 Anxiety disorder, unspecified: Secondary | ICD-10-CM

## 2019-11-29 MED ORDER — ESCITALOPRAM OXALATE 20 MG PO TABS: 20.0000 mg | ORAL_TABLET | Freq: Every day | ORAL | 0 refills | Status: DC

## 2019-11-29 NOTE — Telephone Encounter (Signed)
Pt calling; is having a hard time coping; is crying a lot; can't tell any diff on lexapro 10mg  - feels it's just keeping depression/anxiety at bay. Would like dose increased. [phone] Pt lives far away now and has a new job; does not want to come and be seen.  Pharm is correct in chart.

## 2019-11-29 NOTE — Telephone Encounter (Signed)
Per our discussion on secure chat. Increase in dosage sent to her pharmacy. Please get EPDS for today. If anything greater than a 0 on number 10, then let me know.  Otherwise, we can have a phone appt in 3-4 weeks to follow up on how she's doing. We can speak sooner, if she needs that.

## 2019-11-29 NOTE — Telephone Encounter (Signed)
EPDS done; pt tx'd to AO for scheduling of telephone appt in 3-4wks.

## 2019-12-11 ENCOUNTER — Other Ambulatory Visit: Payer: Self-pay | Admitting: Obstetrics and Gynecology

## 2019-12-11 ENCOUNTER — Telehealth: Payer: Self-pay

## 2019-12-11 DIAGNOSIS — O9122 Nonpurulent mastitis associated with the puerperium: Secondary | ICD-10-CM

## 2019-12-11 MED ORDER — DICLOXACILLIN SODIUM 500 MG PO CAPS: 500.0000 mg | ORAL_CAPSULE | Freq: Four times a day (QID) | ORAL | 0 refills | Status: AC

## 2019-12-11 NOTE — Telephone Encounter (Signed)
Sent [facility] a message. Waiting on response but she will probably need an appt to be seen

## 2019-12-11 NOTE — Telephone Encounter (Signed)
Pt states she has mastitis, her one breast feels Hot, and a  Stinging pain she feels sweaty, flu like. Please send in a RX

## 2019-12-31 ENCOUNTER — Other Ambulatory Visit: Payer: Self-pay | Admitting: Obstetrics and Gynecology

## 2019-12-31 DIAGNOSIS — F419 Anxiety disorder, unspecified: Secondary | ICD-10-CM

## 2019-12-31 DIAGNOSIS — F32A Depression, unspecified: Secondary | ICD-10-CM

## 2020-01-04 ENCOUNTER — Ambulatory Visit: Payer: 59 | Admitting: Obstetrics and Gynecology

## 2020-02-06 ENCOUNTER — Other Ambulatory Visit: Payer: Self-pay

## 2020-02-06 DIAGNOSIS — Z20822 Contact with and (suspected) exposure to covid-19: Secondary | ICD-10-CM

## 2020-02-08 LAB — SARS-COV-2, NAA 2 DAY TAT

## 2020-02-08 LAB — NOVEL CORONAVIRUS, NAA: SARS-CoV-2, NAA: NOT DETECTED

## 2020-02-29 IMAGING — DX DG CHEST 2V
2 series · 2 of 2 positions shown · non-contrast
Comparison: None.

CLINICAL DATA: Cough and cold symptoms for several weeks

EXAM:
CHEST - 2 VIEW

[chest pa]
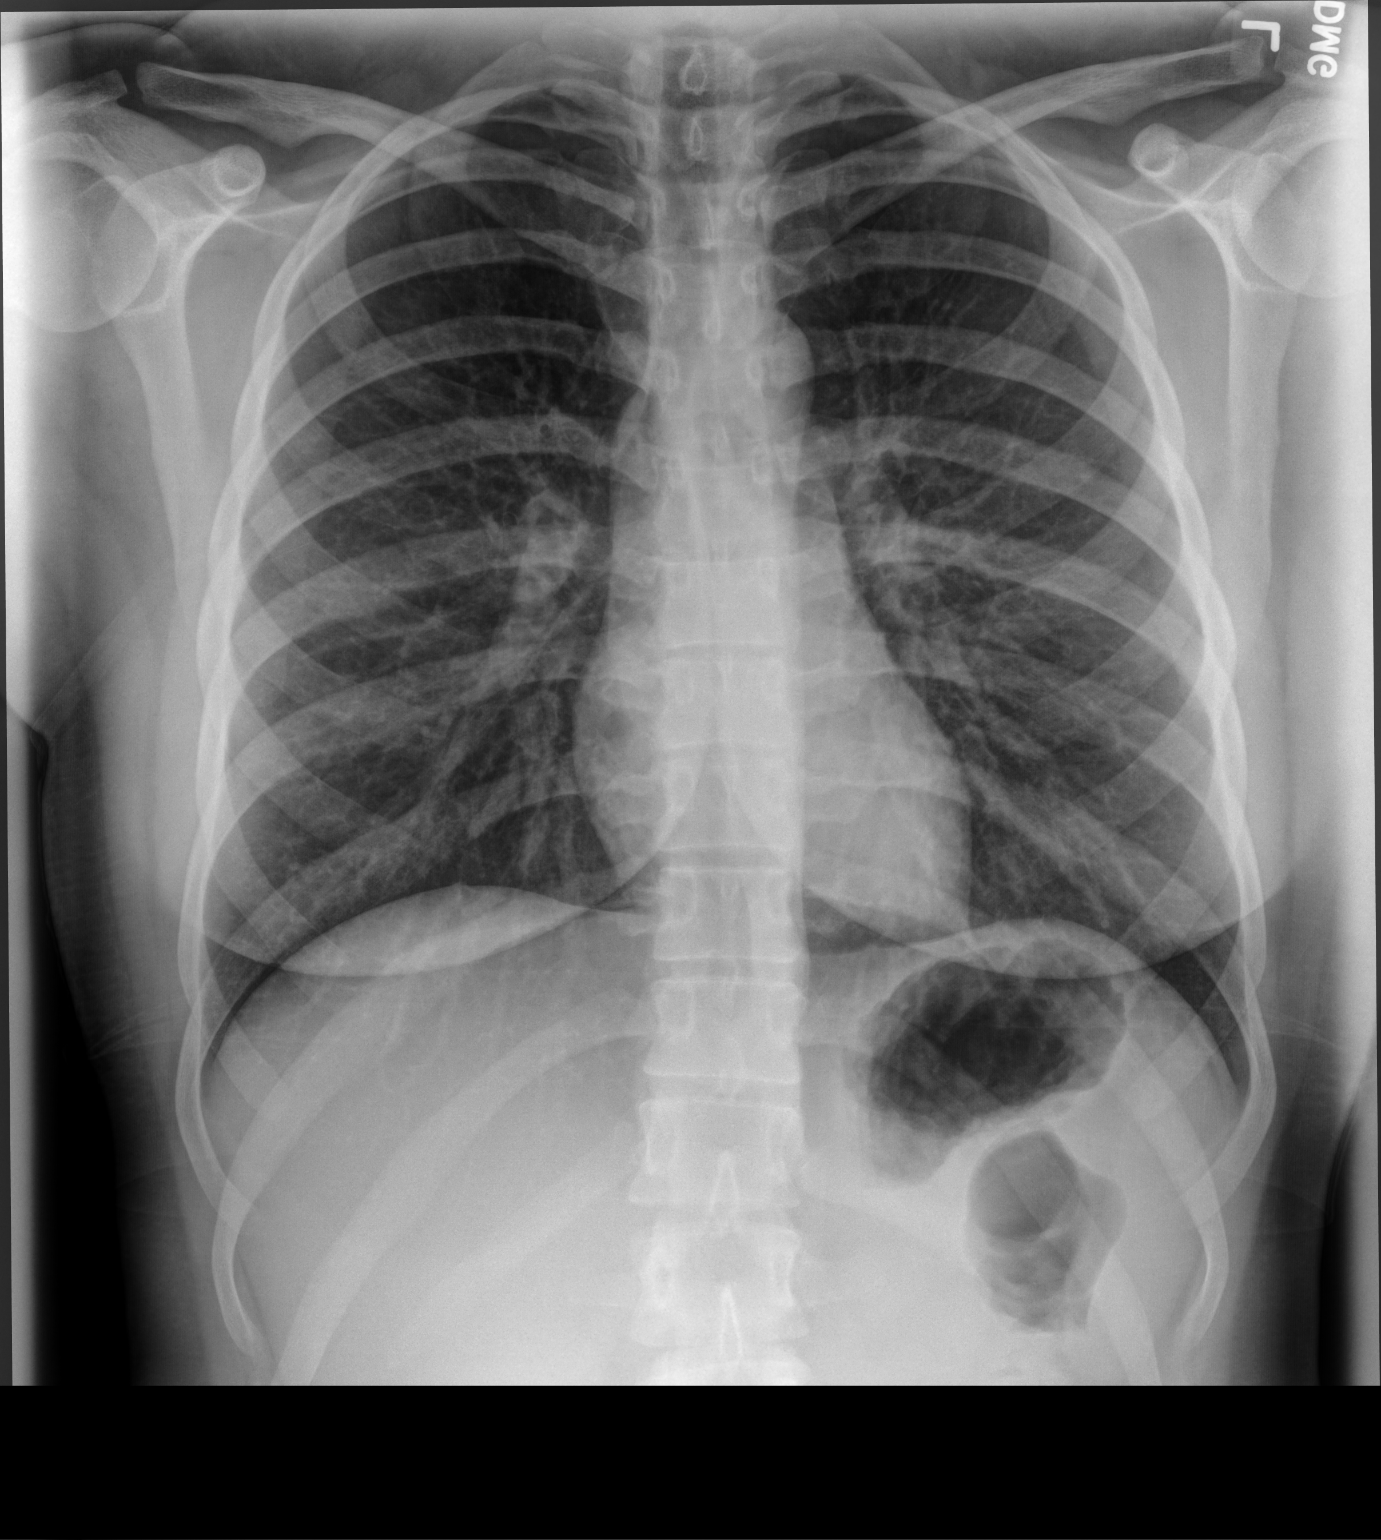

[chest lat]
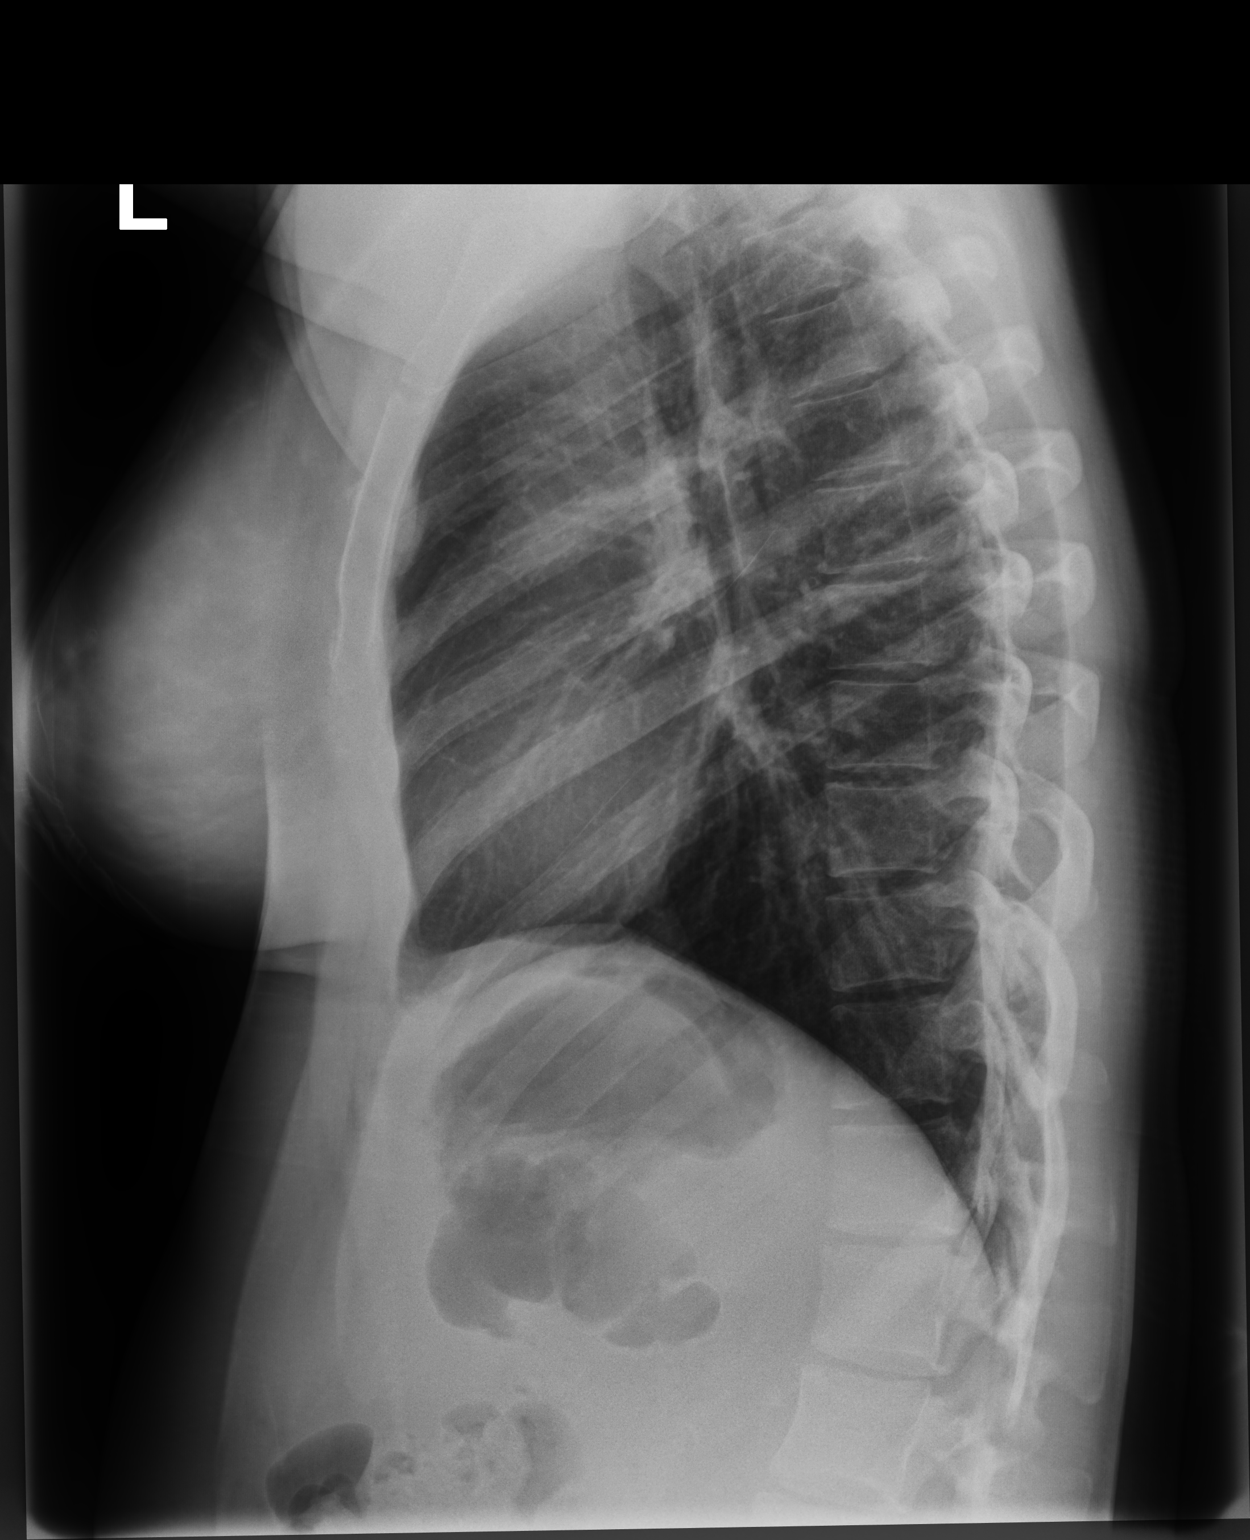

[2 of 2 positions shown; findings below may reference images not displayed]

FINDINGS: The heart size and mediastinal contours are within normal limits.
Both lungs are clear. The visualized skeletal structures are
unremarkable.
IMPRESSION: No active cardiopulmonary disease.

## 2020-03-19 ENCOUNTER — Telehealth: Payer: Self-pay | Admitting: *Deleted

## 2020-03-19 MED ORDER — ERYTHROMYCIN 5 MG/GM OP OINT: 1.0000 "application " | TOPICAL_OINTMENT | Freq: Three times a day (TID) | OPHTHALMIC | 0 refills | Status: AC

## 2020-03-19 NOTE — Telephone Encounter (Signed)
Patient called stating that her children are in pre-school and have pink eye. Patient stated that she took her kids to their pediatrician and they were given eye drops. Patient stated that she woke up this morning with her eye crusted over, redness and itching. Patient stated that she has moved to [place] and has not gotten a new PCP. Patient stated several months ago she had covid symptoms and tried to get into [facility] and was told that they could not see her because she was a patient here. Patient stated that she has been using her OB/GYN as her PCP because she has moved. Patient stated that it is such an ordeal getting in with another [place] since she is a patient here. Patient wanted to know if Dr. Alphonsus Sias would send in some eye drops for her for pink eye. Patient had to hang up because she was getting a call back from her kid's pedestrian's office. Pharmacy [facility]

## 2020-03-19 NOTE — Telephone Encounter (Signed)
Please let her know that I did send an eye ointment for her to use.  Then forward her chart to [facility] to facilitate her getting new [place] doctor at her closest location

## 2020-03-19 NOTE — Telephone Encounter (Signed)
Spoke to pt. Will send to [facility] to see if she can help her get set up with TOC at [facility].

## 2020-03-20 NOTE — Telephone Encounter (Signed)
okay

## 2022-03-12 ENCOUNTER — Telehealth: Payer: Self-pay

## 2022-03-12 NOTE — Telephone Encounter (Signed)
Received message to do [facility] call for patient looks like she has transferred to another PCP. Ok to remove you ad PCP.

## 2022-03-16 NOTE — Telephone Encounter (Signed)
Update made no further action needed.
